# Patient Record
Sex: Male | Born: 1985 | Hispanic: No | Marital: Single | Smoking: Former smoker
Health system: Southern US, Community
[De-identification: ages and names within clinical notes are randomized; demographics above are authoritative.]

## PROBLEM LIST (undated history)

## (undated) HISTORY — PX: TOE SURGERY: SHX1073

---
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
## 2014-09-14 ENCOUNTER — Ambulatory Visit (INDEPENDENT_AMBULATORY_CARE_PROVIDER_SITE_OTHER): Payer: BC Managed Care – PPO | Admitting: Family Medicine

## 2014-09-14 ENCOUNTER — Ambulatory Visit (INDEPENDENT_AMBULATORY_CARE_PROVIDER_SITE_OTHER): Payer: BC Managed Care – PPO

## 2014-09-14 VITALS — BP 122/84 | HR 48 | Temp 98.1°F | Resp 18 | Ht 72.0 in | Wt 154.8 lb

## 2014-09-14 DIAGNOSIS — I498 Other specified cardiac arrhythmias: Secondary | ICD-10-CM

## 2014-09-14 DIAGNOSIS — R5381 Other malaise: Secondary | ICD-10-CM

## 2014-09-14 DIAGNOSIS — R001 Bradycardia, unspecified: Secondary | ICD-10-CM

## 2014-09-14 DIAGNOSIS — R5383 Other fatigue: Secondary | ICD-10-CM

## 2014-09-14 DIAGNOSIS — R079 Chest pain, unspecified: Secondary | ICD-10-CM

## 2014-09-14 LAB — POCT CBC
Granulocyte percent: 65.3 %G (ref 37–80)
HEMATOCRIT: 45.8 % (ref 43.5–53.7)
Hemoglobin: 14.9 g/dL (ref 14.1–18.1)
LYMPH, POC: 2.1 (ref 0.6–3.4)
MCH: 29.5 pg (ref 27–31.2)
MCHC: 32.6 g/dL (ref 31.8–35.4)
MCV: 90.4 fL (ref 80–97)
MID (CBC): 0.3 (ref 0–0.9)
MPV: 8.8 fL (ref 0–99.8)
POC GRANULOCYTE: 4.4 (ref 2–6.9)
POC LYMPH PERCENT: 30.8 %L (ref 10–50)
POC MID %: 3.9 %M (ref 0–12)
Platelet Count, POC: 163 10*3/uL (ref 142–424)
RBC: 5.06 M/uL (ref 4.69–6.13)
RDW, POC: 13.5 %
WBC: 6.8 10*3/uL (ref 4.6–10.2)

## 2014-09-14 NOTE — Progress Notes (Signed)
Subjective:    Patient ID: Tyrone Berg, male    DOB: [birth year], 28 y.o.   MRN: [id number]  HPI Patient presents to clinic for chest and back discomfort that has been intermittently present for 6 months. Pain is located on left side deep within chest wall and under the shoulder on left side. Pain does not radiate. Patient unable to qualify/quantify pain. "Discomfort" is present 1-2x/week and has increased to this frequency in the past few months. Activity sometimes makes discomfort better, but he has not tried any medications or ice for relief. Discomfort not associated with meal times or position changes. Duration varies, but does not last longer than minutes.   Patient states that he gets a full nights sleep (at least 8 hrs), does not snore, and has not been told that he stops breathing while he sleeps. He feels rested in the morning.  Patient quit smoking 2 days ago after smoking 1/2 pack/day intermittently for 4 years. He additionally smokes marijuana every few days and used cocaine 2-3x over a year ago.   Review of Systems  Constitutional: Positive for fatigue. Negative for chills, activity change, appetite change and unexpected weight change.  HENT: Negative for congestion, ear pain, nosebleeds, rhinorrhea, sinus pressure, sneezing and sore throat.   Eyes: Negative.   Respiratory: Negative for choking and wheezing.   Cardiovascular: Negative for leg swelling.  Gastrointestinal: Negative for diarrhea and constipation.       Denies heartburn.  Genitourinary: Negative for flank pain, discharge, scrotal swelling, difficulty urinating and testicular pain.  Musculoskeletal: Positive for arthralgias and back pain. Negative for gait problem, joint swelling and neck pain.  Skin: Negative for color change, pallor and rash.  Allergic/Immunologic: Negative.  Negative for environmental allergies and food allergies.  Psychiatric/Behavioral: Negative.  Negative for behavioral problems, sleep  disturbance and dysphoric mood. The patient is not nervous/anxious.        Objective:   Physical Exam  Constitutional: He is oriented to person, place, and time. He appears well-developed and well-nourished. No distress.  HENT:  Head: Normocephalic and atraumatic.  Right Ear: External ear normal.  Left Ear: External ear normal.  Mouth/Throat: Oropharynx is clear and moist.  Eyes: Conjunctivae are normal. Pupils are equal, round, and reactive to light. Right eye exhibits no discharge. Left eye exhibits no discharge. No scleral icterus.  Neck: Normal range of motion. Neck supple. No JVD present.  Cardiovascular: Regular rhythm and normal heart sounds.  Bradycardia present.  Exam reveals no gallop.   No murmur heard. Pulmonary/Chest: Effort normal and breath sounds normal. He has no wheezes. He has no rales. He exhibits no tenderness.  No tenderness to palpation of chest wall. Pain not reproducible.  Abdominal: Soft. Bowel sounds are normal. He exhibits no distension and no mass. There is no tenderness. There is no guarding.  Musculoskeletal: Normal range of motion. He exhibits no edema and no tenderness.  Lymphadenopathy:    He has no cervical adenopathy.  Neurological: He is alert and oriented to person, place, and time. He exhibits normal muscle tone. Coordination normal.  Skin: Skin is warm and dry. He is not diaphoretic.  Psychiatric: He has a normal mood and affect. His behavior is normal. Judgment and thought content normal.   Blood pressure 122/84, pulse 48, temperature 98.1 F (36.7 C), temperature source Oral, resp. rate 18, height 6' (1.829 m), weight 154 lb 12.8 oz (70.217 kg), SpO2 100.00%.     EKG reading with Dr. Alwyn Ren: Bradycardia [facility] reading (  PRIMARY) by  Dr. Alwyn Ren: No acute findings.  Results for orders placed in visit on 09/14/14  POCT CBC      Result Value Ref Range   WBC 6.8  4.6 - 10.2 K/uL   Lymph, poc 2.1  0.6 - 3.4   POC LYMPH PERCENT 30.8  10 - 50 %L    MID (cbc) 0.3  0 - 0.9   POC MID % 3.9  0 - 12 %M   POC Granulocyte 4.4  2 - 6.9   Granulocyte percent 65.3  37 - 80 %G   RBC 5.06  4.69 - 6.13 M/uL   Hemoglobin 14.9  14.1 - 18.1 g/dL   HCT, POC 16.1  09.6 - 53.7 %   MCV 90.4  80 - 97 fL   MCH, POC 29.5  27 - 31.2 pg   MCHC 32.6  31.8 - 35.4 g/dL   RDW, POC 04.5     Platelet Count, POC 163  142 - 424 K/uL   MPV 8.8  0 - 99.8 fL     Assessment & Plan:   1. Chest pain/Bradycardia, unspecified CXR is normal and EKG shows bradycardia. CBC is within range. At this time chest pain etiology unclear. Could be musculoskeletal, but less likely as pain not reproducible. Due to bradycardia, pain, and fatigue will refer to cardiology. - EKG 12-Lead:  - Comprehensive metabolic panel - DG Chest 2 View; Future - Cardiology referral ordered.  2. Other malaise and fatigue CBC within range. Fatigue possibly related to bradycardia. Cardiology referral ordered. - TSH - POCT CBC

## 2014-09-14 NOTE — Progress Notes (Signed)
Problems discussed with the physician assistant. Chest x-ray and EKG reviewed. Advise that patient should see a cardiologist because of the bradycardia. I would imagine that the regular marijuana use might tend to raise the heart) and lowering it. Agree with assessment and plan.

## 2014-09-15 LAB — COMPREHENSIVE METABOLIC PANEL
ALT: 13 U/L (ref 0–53)
AST: 21 U/L (ref 0–37)
Albumin: 5 g/dL (ref 3.5–5.2)
Alkaline Phosphatase: 56 U/L (ref 39–117)
BILIRUBIN TOTAL: 1.2 mg/dL (ref 0.2–1.2)
BUN: 17 mg/dL (ref 6–23)
CALCIUM: 9.9 mg/dL (ref 8.4–10.5)
CHLORIDE: 103 meq/L (ref 96–112)
CO2: 28 mEq/L (ref 19–32)
CREATININE: 0.86 mg/dL (ref 0.50–1.35)
Glucose, Bld: 76 mg/dL (ref 70–99)
POTASSIUM: 4.2 meq/L (ref 3.5–5.3)
Sodium: 142 mEq/L (ref 135–145)
Total Protein: 7.6 g/dL (ref 6.0–8.3)

## 2014-09-15 LAB — TSH: TSH: 1.335 u[IU]/mL (ref 0.350–4.500)

## 2014-10-18 ENCOUNTER — Institutional Professional Consult (permissible substitution): Payer: Self-pay | Admitting: Cardiology

## 2015-12-15 ENCOUNTER — Encounter (HOSPITAL_COMMUNITY): Payer: Self-pay | Admitting: Emergency Medicine

## 2015-12-15 DIAGNOSIS — R1030 Lower abdominal pain, unspecified: Secondary | ICD-10-CM | POA: Diagnosis present

## 2015-12-15 DIAGNOSIS — R112 Nausea with vomiting, unspecified: Secondary | ICD-10-CM | POA: Diagnosis not present

## 2015-12-15 DIAGNOSIS — R197 Diarrhea, unspecified: Secondary | ICD-10-CM | POA: Diagnosis not present

## 2015-12-15 DIAGNOSIS — R948 Abnormal results of function studies of other organs and systems: Secondary | ICD-10-CM | POA: Insufficient documentation

## 2015-12-15 DIAGNOSIS — R109 Unspecified abdominal pain: Secondary | ICD-10-CM | POA: Diagnosis not present

## 2015-12-15 DIAGNOSIS — E860 Dehydration: Secondary | ICD-10-CM | POA: Diagnosis not present

## 2015-12-15 DIAGNOSIS — Z87891 Personal history of nicotine dependence: Secondary | ICD-10-CM | POA: Diagnosis not present

## 2015-12-15 LAB — COMPREHENSIVE METABOLIC PANEL
ALK PHOS: 55 U/L (ref 38–126)
ALT: 13 U/L — AB (ref 17–63)
AST: 19 U/L (ref 15–41)
Albumin: 4.6 g/dL (ref 3.5–5.0)
Anion gap: 13 (ref 5–15)
BUN: 18 mg/dL (ref 6–20)
CALCIUM: 9.9 mg/dL (ref 8.9–10.3)
CHLORIDE: 104 mmol/L (ref 101–111)
CO2: 25 mmol/L (ref 22–32)
CREATININE: 1.06 mg/dL (ref 0.61–1.24)
Glucose, Bld: 123 mg/dL — ABNORMAL HIGH (ref 65–99)
Potassium: 3.5 mmol/L (ref 3.5–5.1)
Sodium: 142 mmol/L (ref 135–145)
TOTAL PROTEIN: 7.5 g/dL (ref 6.5–8.1)
Total Bilirubin: 1.6 mg/dL — ABNORMAL HIGH (ref 0.3–1.2)

## 2015-12-15 LAB — CBC
HCT: 42.2 % (ref 39.0–52.0)
Hemoglobin: 14.6 g/dL (ref 13.0–17.0)
MCH: 29.4 pg (ref 26.0–34.0)
MCHC: 34.6 g/dL (ref 30.0–36.0)
MCV: 85.1 fL (ref 78.0–100.0)
PLATELETS: 189 10*3/uL (ref 150–400)
RBC: 4.96 MIL/uL (ref 4.22–5.81)
RDW: 12.1 % (ref 11.5–15.5)
WBC: 13.6 10*3/uL — AB (ref 4.0–10.5)

## 2015-12-15 LAB — LIPASE, BLOOD: LIPASE: 20 U/L (ref 11–51)

## 2015-12-15 MED ORDER — FENTANYL CITRATE (PF) 100 MCG/2ML IJ SOLN
INTRAMUSCULAR | Status: AC
  Filled 2015-12-15: qty 2

## 2015-12-15 MED ORDER — FENTANYL CITRATE (PF) 100 MCG/2ML IJ SOLN
50.0000 ug | Freq: Once | INTRAMUSCULAR | Status: AC
  Administered 2015-12-15: 50 ug via NASAL

## 2015-12-15 NOTE — ED Notes (Signed)
Pt. reports low abdominal pain with emesis and diarrhea onset this afternoon , denies fever or chills.

## 2015-12-15 NOTE — ED Notes (Signed)
Pts visitor approached nurses station asking for pain medication for patient. Pt laying on bench in waiting room and appears as though he does not feel well.

## 2015-12-15 NOTE — ED Notes (Signed)
Pt reports he still does not have to urinate.

## 2015-12-16 ENCOUNTER — Emergency Department (HOSPITAL_COMMUNITY)
Admission: EM | Admit: 2015-12-16 | Discharge: 2015-12-16 | Disposition: A | Discharge status: Home / Self Care | Payer: BLUE CROSS/BLUE SHIELD | Attending: Emergency Medicine | Admitting: Emergency Medicine

## 2015-12-16 DIAGNOSIS — R112 Nausea with vomiting, unspecified: Secondary | ICD-10-CM

## 2015-12-16 DIAGNOSIS — R109 Unspecified abdominal pain: Secondary | ICD-10-CM

## 2015-12-16 DIAGNOSIS — R17 Unspecified jaundice: Secondary | ICD-10-CM

## 2015-12-16 DIAGNOSIS — E860 Dehydration: Secondary | ICD-10-CM

## 2015-12-16 DIAGNOSIS — R197 Diarrhea, unspecified: Secondary | ICD-10-CM

## 2015-12-16 LAB — URINALYSIS, ROUTINE W REFLEX MICROSCOPIC
GLUCOSE, UA: NEGATIVE mg/dL
HGB URINE DIPSTICK: NEGATIVE
Ketones, ur: >80 mg/dL — AB
Leukocytes, UA: NEGATIVE
Nitrite: NEGATIVE
Protein, ur: 100 mg/dL — AB
SPECIFIC GRAVITY, URINE: 1.039 — AB (ref 1.005–1.030)
pH: 7 (ref 5.0–8.0)

## 2015-12-16 LAB — URINE MICROSCOPIC-ADD ON

## 2015-12-16 MED ORDER — SODIUM CHLORIDE 0.9 % IV SOLN: 1000.0000 mL | INTRAVENOUS | Status: DC

## 2015-12-16 MED ORDER — SODIUM CHLORIDE 0.9 % IV SOLN
1000.0000 mL | Freq: Once | INTRAVENOUS | Status: AC
  Administered 2015-12-16: 1000 mL via INTRAVENOUS

## 2015-12-16 MED ORDER — ONDANSETRON HCL 4 MG PO TABS: 4.0000 mg | ORAL_TABLET | Freq: Four times a day (QID) | ORAL | Status: DC | PRN

## 2015-12-16 NOTE — ED Provider Notes (Signed)
CSN: [id number]     Arrival date & time 12/15/15  2005 History  By signing my name below, I, Tyrone Berg, attest that this documentation has been prepared under the direction and in the presence of Dione Booze, MD. Electronically Signed: [name], ED Scribe. 12/16/2015. 2:19 AM.      Chief Complaint  Patient presents with  . Abdominal Pain   The history is provided by the patient. No language interpreter was used.   HPI Comments: Tyrone Berg is a 29 y.o. male who presents to the Emergency Department complaining of improving mild low abdominal pain onset 12 hours PTA. Pt reports associated nausea, vomiting and diarrhea. Pt states that vomiting provided temporary relief. Pt states that he was not able to tolerate solids or liquids. Pt states that since being in the ED the pain has gradually improved on its own. Pt denies fever or there related symptoms. Pt reports similar episode of pain after eating pizza 1 week ago that was not as sever. He states that that his pain began today after eating pizza.   History reviewed. No pertinent past medical history. Past Surgical History  Procedure Laterality Date  . Toe surgery     Family History  Problem Relation Age of Onset  . Asthma Father    Social History  Substance Use Topics  . Smoking status: Former Smoker -- 0.50 packs/day for 4 years    Quit date: 09/12/2014  . Smokeless tobacco: None  . Alcohol Use: Yes    Review of Systems  Constitutional: Negative for fever and chills.  Gastrointestinal: Positive for nausea, vomiting, abdominal pain and diarrhea.  All other systems reviewed and are negative.    Allergies  Review of patient's allergies indicates no known allergies.  Home Medications   Prior to Admission medications   Not on File   BP 127/64 mmHg  Pulse 51  Temp(Src) 98.4 F (36.9 C) (Oral)  Resp 16  SpO2 98%   Physical Exam  Constitutional: He is oriented to person, place, and time. He appears  well-developed and well-nourished. No distress.  HENT:  Head: Normocephalic and atraumatic.  Eyes: Conjunctivae and EOM are normal. Pupils are equal, round, and reactive to light.  Neck: Normal range of motion. Neck supple. No JVD present.  Cardiovascular: Normal rate, regular rhythm and normal heart sounds.   No murmur heard. Pulmonary/Chest: Effort normal and breath sounds normal. He has no wheezes. He has no rales. He exhibits no tenderness.  Abdominal: Soft. He exhibits no distension and no mass. There is no guarding.  Musculoskeletal: Normal range of motion.  Lymphadenopathy:    He has no cervical adenopathy.  Neurological: He is alert and oriented to person, place, and time.  Skin: Skin is warm and dry. No rash noted.  Psychiatric: He has a normal mood and affect. His behavior is normal. Judgment and thought content normal.  Nursing note and vitals reviewed.   ED Course  Procedures (including critical care time) DIAGNOSTIC STUDIES: Oxygen Saturation is 98% on RA, normal by my interpretation.    COORDINATION OF CARE: 2:25 AM-Discussed treatment plan with pt at bedside and pt agreed to plan.     Labs Review Results for orders placed or performed during the hospital encounter of 12/16/15  Lipase, blood  Result Value Ref Range   Lipase 20 11 - 51 U/L  Comprehensive metabolic panel  Result Value Ref Range   Sodium 142 135 - 145 mmol/L   Potassium 3.5 3.5 - 5.1  mmol/L   Chloride 104 101 - 111 mmol/L   CO2 25 22 - 32 mmol/L   Glucose, Bld 123 (H) 65 - 99 mg/dL   BUN 18 6 - 20 mg/dL   Creatinine, Ser 2.441.06 0.61 - 1.24 mg/dL   Calcium 9.9 8.9 - 01.010.3 mg/dL   Total Protein 7.5 6.5 - 8.1 g/dL   Albumin 4.6 3.5 - 5.0 g/dL   AST 19 15 - 41 U/L   ALT 13 (L) 17 - 63 U/L   Alkaline Phosphatase 55 38 - 126 U/L   Total Bilirubin 1.6 (H) 0.3 - 1.2 mg/dL   GFR calc non Af Amer >60 >60 mL/min   GFR calc Af Amer >60 >60 mL/min   Anion gap 13 5 - 15  CBC  Result Value Ref Range    WBC 13.6 (H) 4.0 - 10.5 K/uL   RBC 4.96 4.22 - 5.81 MIL/uL   Hemoglobin 14.6 13.0 - 17.0 g/dL   HCT 27.242.2 53.639.0 - 64.452.0 %   MCV 85.1 78.0 - 100.0 fL   MCH 29.4 26.0 - 34.0 pg   MCHC 34.6 30.0 - 36.0 g/dL   RDW 03.412.1 74.211.5 - 59.515.5 %   Platelets 189 150 - 400 K/uL  Urinalysis, Routine w reflex microscopic (not at [facility])  Result Value Ref Range   Color, Urine AMBER (A) YELLOW   APPearance CLEAR CLEAR   Specific Gravity, Urine 1.039 (H) 1.005 - 1.030   pH 7.0 5.0 - 8.0   Glucose, UA NEGATIVE NEGATIVE mg/dL   Hgb urine dipstick NEGATIVE NEGATIVE   Bilirubin Urine SMALL (A) NEGATIVE   Ketones, ur >80 (A) NEGATIVE mg/dL   Protein, ur 638100 (A) NEGATIVE mg/dL   Nitrite NEGATIVE NEGATIVE   Leukocytes, UA NEGATIVE NEGATIVE  Urine microscopic-add on  Result Value Ref Range   Squamous Epithelial / LPF 0-5 (A) NONE SEEN   WBC, UA 0-5 0 - 5 WBC/hpf   RBC / HPF 0-5 0 - 5 RBC/hpf   Bacteria, UA FEW (A) NONE SEEN   Urine-Other MUCOUS PRESENT     MDM   Final diagnoses:  Abdominal pain, unspecified abdominal location  Nausea vomiting and diarrhea  Total bilirubin, elevated  Dehydration      Episode of nausea, vomiting and abdominal pain which is resolved. Laboratory workup shows significant dehydration with appearance specific gravity 1.039. He is given IV hydration. Laboratory workup also showed mildly elevated blood sugar as well as elevated bilirubin. Elevated bilirubin could be Gilbert's Disease. However, his father states that he had a similar episode several weeks ago and both episodes to occur after eating pizza. This is worrisome for possible biliary tract disease. He does not have any transaminases or alkaline phosphatase elevation. I have recommended that he talk with PCP about outpatient gallbladder ultrasound. He is discharged with prescription for ondansetron.   I personally performed the services described in this documentation, which was scribed in my presence. The recorded information  has been reviewed and is accurate.       Dione Boozeavid Mennie Spiller, MD 12/16/15 [phone]

## 2015-12-16 NOTE — Discharge Instructions (Signed)
Take loperamide (Imodium AD) as needed for diarrhea.  Talk with your doctor about getting an ultrasound test to see if you have gallstones.  Your bilirubin (a test of your liver) was slightly elevated today. Please talk with your primary care provider about this. 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    Abdominal Pain, Adult Many things can cause abdominal pain. Usually, abdominal pain is not caused by a disease and will improve without treatment. It can often be observed and treated at home. Your health care provider will do a physical exam and possibly order blood tests and X-rays to help determine the seriousness of your pain. However, in many cases, more time must pass before a clear cause of the pain can be found. Before that point, your health care provider may not know if you need more testing or further treatment. HOME CARE INSTRUCTIONS Monitor your abdominal pain for any changes. The following actions may help to alleviate any discomfort you are experiencing:  Only take over-the-counter or prescription medicines as directed by your health care provider.  Do not take laxatives unless directed to do so by your health care provider.  Try a clear liquid diet (broth, tea, or water) as directed by your health care provider. Slowly move to a bland diet as tolerated. SEEK MEDICAL CARE IF:  You have unexplained abdominal pain.  You have abdominal pain associated with nausea or diarrhea.  You have pain when you urinate or have a bowel movement.  You experience abdominal pain that wakes you in the night.  You have abdominal pain that is worsened or improved by eating food.  You have abdominal pain that is worsened with eating fatty foods.  You have a fever. SEEK IMMEDIATE MEDICAL CARE IF:  Your pain does not go away within 2 hours.  You keep throwing up (vomiting).  Your pain is felt only in portions of the abdomen, such as the  right side or the left lower portion of the abdomen.  You pass bloody or black tarry stools. MAKE SURE YOU:  Understand these instructions.  Will watch your condition.  Will get help right away if you are not doing well or get worse.   This information is not intended to replace advice given to you by your health care provider. Make sure you discuss any questions you have with your health care provider.   Document Released: 09/17/2005 Document Revised: 08/29/2015 Document Reviewed: 08/17/2013 Elsevier Interactive Patient Education 2016 Elsevier Inc.  Nausea and Vomiting Nausea is a sick feeling that often comes before throwing up (vomiting). Vomiting is a reflex where stomach contents come out of your mouth. Vomiting can cause severe loss of body fluids (dehydration). Children and elderly adults can become dehydrated quickly, especially if they also have diarrhea. Nausea and vomiting are symptoms of a condition or disease. It is important to find the cause of your symptoms. CAUSES   Direct irritation of the stomach lining. This irritation can result from increased acid production (gastroesophageal reflux disease), infection, food poisoning, taking certain medicines (such as nonsteroidal anti-inflammatory drugs), alcohol use, or tobacco use.  Signals from the brain.These signals could be caused by a headache, heat exposure, an inner ear disturbance, increased pressure in the brain from injury, infection, a tumor, or a concussion, pain, emotional stimulus, or metabolic problems.  An obstruction in the gastrointestinal tract (bowel obstruction).  Illnesses such as diabetes, hepatitis, gallbladder problems, appendicitis, kidney problems, cancer, sepsis,  atypical symptoms of a heart attack, or eating disorders.  Medical treatments such as chemotherapy and radiation.  Receiving medicine that makes you sleep (general anesthetic) during surgery. DIAGNOSIS Your caregiver may ask for tests to  be done if the problems do not improve after a few days. Tests may also be done if symptoms are severe or if the reason for the nausea and vomiting is not clear. Tests may include:  Urine tests.  Blood tests.  Stool tests.  Cultures (to look for evidence of infection).  X-rays or other imaging studies. Test results can help your caregiver make decisions about treatment or the need for additional tests. TREATMENT You need to stay well hydrated. Drink frequently but in small amounts.You may wish to drink water, sports drinks, clear broth, or eat frozen ice pops or gelatin dessert to help stay hydrated.When you eat, eating slowly may help prevent nausea.There are also some antinausea medicines that may help prevent nausea. HOME CARE INSTRUCTIONS   Take all medicine as directed by your caregiver.  If you do not have an appetite, do not force yourself to eat. However, you must continue to drink fluids.  If you have an appetite, eat a normal diet unless your caregiver tells you differently.  Eat a variety of complex carbohydrates (rice, wheat, potatoes, bread), lean meats, yogurt, fruits, and vegetables.  Avoid high-fat foods because they are more difficult to digest.  Drink enough water and fluids to keep your urine clear or pale yellow.  If you are dehydrated, ask your caregiver for specific rehydration instructions. Signs of dehydration may include:  Severe thirst.  Dry lips and mouth.  Dizziness.  Dark urine.  Decreasing urine frequency and amount.  Confusion.  Rapid breathing or pulse. SEEK IMMEDIATE MEDICAL CARE IF:   You have blood or brown flecks (like coffee grounds) in your vomit.  You have black or bloody stools.  You have a severe headache or stiff neck.  You are confused.  You have severe abdominal pain.  You have chest pain or trouble breathing.  You do not urinate at least once every 8 hours.  You develop cold or clammy skin.  You continue to  vomit for longer than 24 to 48 hours.  You have a fever. MAKE SURE YOU:   Understand these instructions.  Will watch your condition.  Will get help right away if you are not doing well or get worse.   This information is not intended to replace advice given to you by your health care provider. Make sure you discuss any questions you have with your health care provider.   Document Released: 12/08/2005 Document Revised: 03/01/2012 Document Reviewed: 05/07/2011 Elsevier Interactive Patient Education 2016 Elsevier Inc.  Diarrhea Diarrhea is frequent loose and watery bowel movements. It can cause you to feel weak and dehydrated. Dehydration can cause you to become tired and thirsty, have a dry mouth, and have decreased urination that often is dark yellow. Diarrhea is a sign of another problem, most often an infection that will not last long. In most cases, diarrhea typically lasts 2-3 days. However, it can last longer if it is a sign of something more serious. It is important to treat your diarrhea as directed by your caregiver to lessen or prevent future episodes of diarrhea. CAUSES  Some common causes include:  Gastrointestinal infections caused by viruses, bacteria, or parasites.  Food poisoning or food allergies.  Certain medicines, such as antibiotics, chemotherapy, and laxatives.  Artificial sweeteners and fructose.  Digestive disorders. HOME CARE INSTRUCTIONS  Ensure adequate fluid intake (hydration): Have 1 cup (8 oz) of fluid for each diarrhea episode. Avoid fluids that contain simple sugars or sports drinks, fruit juices, whole milk products, and sodas. Your urine should be clear or pale yellow if you are drinking enough fluids. Hydrate with an oral rehydration solution that you can purchase at pharmacies, retail stores, and online. You can prepare an oral rehydration solution at home by mixing the following ingredients together:   - tsp table salt.   tsp baking soda.    tsp salt substitute containing potassium chloride.  1  tablespoons sugar.  1 L (34 oz) of water.  Certain foods and beverages may increase the speed at which food moves through the gastrointestinal (GI) tract. These foods and beverages should be avoided and include:  Caffeinated and alcoholic beverages.  High-fiber foods, such as raw fruits and vegetables, nuts, seeds, and whole grain breads and cereals.  Foods and beverages sweetened with sugar alcohols, such as xylitol, sorbitol, and mannitol.  Some foods may be well tolerated and may help thicken stool including:  Starchy foods, such as rice, toast, pasta, low-sugar cereal, oatmeal, grits, baked potatoes, crackers, and bagels.  Bananas.  Applesauce.  Add probiotic-rich foods to help increase healthy bacteria in the GI tract, such as yogurt and fermented milk products.  Wash your hands well after each diarrhea episode.  Only take over-the-counter or prescription medicines as directed by your caregiver.  Take a warm bath to relieve any burning or pain from frequent diarrhea episodes. SEEK IMMEDIATE MEDICAL CARE IF:   You are unable to keep fluids down.  You have persistent vomiting.  You have blood in your stool, or your stools are black and tarry.  You do not urinate in 6-8 hours, or there is only a small amount of very dark urine.  You have abdominal pain that increases or localizes.  You have weakness, dizziness, confusion, or light-headedness.  You have a severe headache.  Your diarrhea gets worse or does not get better.  You have a fever or persistent symptoms for more than 2-3 days.  You have a fever and your symptoms suddenly get worse. MAKE SURE YOU:   Understand these instructions.  Will watch your condition.  Will get help right away if you are not doing well or get worse.   This information is not intended to replace advice given to you by your health care provider. Make sure you discuss any  questions you have with your health care provider.   Document Released: 11/28/2002 Document Revised: 12/29/2014 Document Reviewed: 08/15/2012 Elsevier Interactive Patient Education 2016 Elsevier Inc.  Ondansetron tablets What is this medicine? ONDANSETRON (on DAN se tron) is used to treat nausea and vomiting caused by chemotherapy. It is also used to prevent or treat nausea and vomiting after surgery. This medicine may be used for other purposes; ask your health care provider or pharmacist if you have questions. What should I tell my health care provider before I take this medicine? They need to know if you have any of these conditions: -heart disease -history of irregular heartbeat -liver disease -low levels of magnesium or potassium in the blood -an unusual or allergic reaction to ondansetron, granisetron, other medicines, foods, dyes, or preservatives -pregnant or trying to get pregnant -breast-feeding How should I use this medicine? Take this medicine by mouth with a glass of water. Follow the directions on your prescription label. Take your doses at regular intervals.  Do not take your medicine more often than directed. Talk to your pediatrician regarding the use of this medicine in children. Special care may be needed. Overdosage: If you think you have taken too much of this medicine contact a poison control center or emergency room at once. NOTE: This medicine is only for you. Do not share this medicine with others. What if I miss a dose? If you miss a dose, take it as soon as you can. If it is almost time for your next dose, take only that dose. Do not take double or extra doses. What may interact with this medicine? Do not take this medicine with any of the following medications: -apomorphine -certain medicines for fungal infections like fluconazole, itraconazole, ketoconazole, posaconazole,  voriconazole -cisapride -dofetilide -dronedarone -pimozide -thioridazine -ziprasidone This medicine may also interact with the following medications: -carbamazepine -certain medicines for depression, anxiety, or psychotic disturbances -fentanyl -linezolid -MAOIs like Carbex, Eldepryl, Marplan, Nardil, and Parnate -methylene blue (injected into a vein) -other medicines that prolong the QT interval (cause an abnormal heart rhythm) -phenytoin -rifampicin -tramadol This list may not describe all possible interactions. Give your health care provider a list of all the medicines, herbs, non-prescription drugs, or dietary supplements you use. Also tell them if you smoke, drink alcohol, or use illegal drugs. Some items may interact with your medicine. What should I watch for while using this medicine? Check with your doctor or health care professional right away if you have any sign of an allergic reaction. What side effects may I notice from receiving this medicine? Side effects that you should report to your doctor or health care professional as soon as possible: -allergic reactions like skin rash, itching or hives, swelling of the face, lips or tongue -breathing problems -confusion -dizziness -fast or irregular heartbeat -feeling faint or lightheaded, falls -fever and chills -loss of balance or coordination -seizures -sweating -swelling of the hands or feet -tightness in the chest -tremors -unusually weak or tired Side effects that usually do not require medical attention (report to your doctor or health care professional if they continue or are bothersome): -constipation or diarrhea -headache This list may not describe all possible side effects. Call your doctor for medical advice about side effects. You may report side effects to FDA at 1-800-FDA-1088. Where should I keep my medicine? Keep out of the reach of children. Store between 2 and 30 degrees C (36 and 86 degrees F).  Throw away any unused medicine after the expiration date. NOTE: This sheet is a summary. It may not cover all possible information. If you have questions about this medicine, talk to your doctor, pharmacist, or health care provider.    2016, Elsevier/Gold Standard. (2013-09-14 16:27:45)

## 2015-12-16 NOTE — ED Notes (Signed)
Patient verbalized understanding of discharge instructions and denies any further needs or questions at this time. VS stable. Patient ambulatory with steady gait.  

## 2016-07-03 ENCOUNTER — Other Ambulatory Visit: Payer: Self-pay | Admitting: Family Medicine

## 2016-07-03 DIAGNOSIS — R079 Chest pain, unspecified: Secondary | ICD-10-CM

## 2016-07-09 ENCOUNTER — Other Ambulatory Visit: Payer: BLUE CROSS/BLUE SHIELD

## 2016-08-11 ENCOUNTER — Encounter (INDEPENDENT_AMBULATORY_CARE_PROVIDER_SITE_OTHER): Payer: Self-pay

## 2016-08-11 ENCOUNTER — Encounter: Payer: Self-pay | Admitting: Internal Medicine

## 2016-08-11 ENCOUNTER — Ambulatory Visit (INDEPENDENT_AMBULATORY_CARE_PROVIDER_SITE_OTHER): Payer: BLUE CROSS/BLUE SHIELD | Admitting: Internal Medicine

## 2016-08-11 VITALS — BP 130/80 | HR 60 | Ht 71.0 in | Wt 156.4 lb

## 2016-08-11 DIAGNOSIS — K639 Disease of intestine, unspecified: Secondary | ICD-10-CM | POA: Diagnosis not present

## 2016-08-11 NOTE — Patient Instructions (Signed)
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 Frequently these patients have had multiple negative GI workups and CT scans.  Treatment consists of avoiding foods that cause gas (especially mexicans, beans and raw vegetables like spinach and salads)  and citrucel 1 heaping tsp twice daily with a large glass of water.  Pain should improve w/in 2 weeks and if not then consider further  work up.     Pulmonary follow up is as needed

## 2016-08-11 NOTE — Assessment & Plan Note (Addendum)
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 until proven otherwise with diet/ citrucel x 2 weeks  Discussed in detail all the  indications, usual  risks and alternatives  relative to the benefits with patient who agrees to proceed with conservative f/u as outlined  Prn  Total time devoted to counseling  = 35/8564m review case with pt/ discussion of options/alternatives/ personally creating written instructions  in presence of pt  then going over those specific  Instructions directly with the pt including how to use all of the meds but in particular covering each new medication in detail and the difference between the maintenance/automatic meds and the prns using an action plan format for the latter.

## 2016-08-11 NOTE — Progress Notes (Signed)
Subjective:     Patient ID: Joice LoftsBrad Ahlgren, male   DOB: [birth year],     MRN: [id number]  HPI  [age] yowm  Quit smoking 07/23/15 with onset of LCP in 02/2014 referred to pulmonary clinic 08/11/2016 by Dr Shelah LewandowskyElkin   08/11/2016 1st [facility] office visit/ Novali Vollman   Chief Complaint  Patient presents with  . Pulmonary Consult    Referred by Dr Jeannetta NapElkins. Pt c/o left side discomfort x 2 wks. He states chest does not feel painful, but more of a "pressure" that is constant, but better when he lies on his left side.   pain always exact same location > 2 years ant L chest diffuse  / sometime radiates to shoulder no pleuritic and no problem with exercise assoc with some bloating.    No obvious day to day or daytime variability or assoc sob/chronic cough or   chest tightness, subjective wheeze or overt sinus or hb symptoms. No unusual exp hx or h/o childhood pna/ asthma or knowledge of premature birth.  Sleeping ok without nocturnal  or early am exacerbation  of respiratory  c/o's or need for noct saba. Also denies any obvious fluctuation of symptoms with weather or environmental changes or other aggravating or alleviating factors except as outlined above   Current Medications, Allergies, Complete Past Medical History, Past Surgical History, Family History, and Social History were reviewed in [facility] electronic medical record.  ROS  The following are not active complaints unless bolded sore throat, dysphagia, dental problems, itching, sneezing,  nasal congestion or excess/ purulent secretions, ear ache,   fever, chills, sweats, unintended wt loss, classically pleuritic or exertional cp, hemoptysis,  orthopnea pnd or leg swelling, presyncope, palpitations, abdominal pain, anorexia, nausea, vomiting, diarrhea  or change in bowel or bladder habits, change in stools or urine, dysuria,hematuria,  rash, arthralgias, visual complaints, headache, numbness, weakness or ataxia or problems with walking or coordination,   change in mood/affect or memory.          Review of Systems     Objective:   Physical Exam    amb wm nad   Wt Readings from Last 3 Encounters:  08/11/16 156 lb 6.4 oz (70.9 kg)  09/14/14 154 lb 12.8 oz (70.2 kg)    Vital signs reviewed   HEENT: nl dentition, turbinates, and oropharynx. Nl external ear canals without cough reflex   NECK :  without JVD/Nodes/TM/ nl carotid upstrokes bilaterally   LUNGS: no acc muscle use,  Nl contour chest which is clear to A and P bilaterally without cough on insp or exp maneuvers   CV:  RRR  no s3 or murmur or increase in P2, no edema   ABD:  soft   with nl inspiratory excursion in the supine position. No bruits or organomegaly, bowel sounds nl - minimally tender LUQ   MS:  Nl gait/ ext warm without deformities, calf tenderness, cyanosis or clubbing No obvious joint restrictions   SKIN: warm and dry without lesions    NEURO:  alert, approp, nl sensorium with  no motor deficits     I personally reviewed images and agree with radiology impression as follows:  CXR:   07/09/16  No acute cardiopulmonary dz  Assessment:

## 2016-09-19 ENCOUNTER — Encounter: Payer: Self-pay | Admitting: Internal Medicine

## 2016-09-19 ENCOUNTER — Ambulatory Visit (INDEPENDENT_AMBULATORY_CARE_PROVIDER_SITE_OTHER): Payer: BLUE CROSS/BLUE SHIELD | Admitting: Internal Medicine

## 2016-09-19 ENCOUNTER — Other Ambulatory Visit (INDEPENDENT_AMBULATORY_CARE_PROVIDER_SITE_OTHER): Payer: BLUE CROSS/BLUE SHIELD

## 2016-09-19 VITALS — BP 114/82 | HR 60 | Ht 71.0 in | Wt 154.0 lb

## 2016-09-19 DIAGNOSIS — K639 Disease of intestine, unspecified: Secondary | ICD-10-CM | POA: Diagnosis not present

## 2016-09-19 DIAGNOSIS — R0789 Other chest pain: Secondary | ICD-10-CM

## 2016-09-19 DIAGNOSIS — R079 Chest pain, unspecified: Secondary | ICD-10-CM | POA: Insufficient documentation

## 2016-09-19 LAB — BRAIN NATRIURETIC PEPTIDE: PRO B NATRI PEPTIDE: 33 pg/mL (ref 0.0–100.0)

## 2016-09-19 LAB — TROPONIN I: TNIDX: 0 ug/l (ref 0.00–0.06)

## 2016-09-19 LAB — BASIC METABOLIC PANEL
BUN: 12 mg/dL (ref 6–23)
CHLORIDE: 107 meq/L (ref 96–112)
CO2: 31 mEq/L (ref 19–32)
Calcium: 9.1 mg/dL (ref 8.4–10.5)
Creatinine, Ser: 0.94 mg/dL (ref 0.40–1.50)
GFR: 101.62 mL/min (ref >60.00)
Glucose, Bld: 78 mg/dL (ref 70–99)
POTASSIUM: 4.6 meq/L (ref 3.5–5.1)
Sodium: 142 mEq/L (ref 135–145)

## 2016-09-19 LAB — CBC WITH DIFFERENTIAL/PLATELET
BASOS ABS: 0 10*3/uL (ref 0.0–0.1)
Basophils Relative: 0.5 % (ref 0.0–3.0)
EOS ABS: 0.1 10*3/uL (ref 0.0–0.7)
Eosinophils Relative: 1.4 % (ref 0.0–5.0)
HEMATOCRIT: 41.4 % (ref 39.0–52.0)
HEMOGLOBIN: 14.4 g/dL (ref 13.0–17.0)
Lymphocytes Relative: 42.9 % (ref 12.0–46.0)
Lymphs Abs: 1.6 10*3/uL (ref 0.7–4.0)
MCHC: 34.8 g/dL (ref 30.0–36.0)
MCV: 85.4 fl (ref 78.0–100.0)
Monocytes Absolute: 0.2 10*3/uL (ref 0.1–1.0)
Monocytes Relative: 6.4 % (ref 3.0–12.0)
Neutro Abs: 1.8 10*3/uL (ref 1.4–7.7)
Neutrophils Relative %: 48.8 % (ref 43.0–77.0)
Platelets: 174 10*3/uL (ref 150.0–400.0)
RBC: 4.84 Mil/uL (ref 4.22–5.81)
RDW: 13.2 % (ref 11.5–15.5)
WBC: 3.7 10*3/uL — ABNORMAL LOW (ref 4.0–10.5)

## 2016-09-19 LAB — SEDIMENTATION RATE: SED RATE: 1 mm/h (ref 0–15)

## 2016-09-19 LAB — TSH: TSH: 1.01 u[IU]/mL (ref 0.35–4.50)

## 2016-09-19 MED ORDER — PANTOPRAZOLE SODIUM 40 MG PO TBEC: 40.0000 mg | DELAYED_RELEASE_TABLET | Freq: Every day | ORAL | 2 refills | Status: DC

## 2016-09-19 MED ORDER — FAMOTIDINE 20 MG PO TABS: ORAL_TABLET | ORAL | 2 refills | Status: DC

## 2016-09-19 NOTE — Assessment & Plan Note (Addendum)
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    Please see instructions for details which were reviewed in writing and the patient given a copy highlighting the part that I personally wrote and discussed at today's ov.

## 2016-09-19 NOTE — Progress Notes (Signed)
Subjective:     Patient ID: Tyrone Berg, male   DOB: September 11, 1986,     MRN: [id number]    Brief patient profile:  62 yowm  Quit smoking 07/23/15 with onset of LCP in 02/2014 referred to pulmonary clinic 08/11/2016 by Tyrone Berg    History of Present Illness  08/11/2016 1st [facility] office visit/ Tyrone Berg   Chief Complaint  Patient presents with  . Pulmonary Consult    Referred by Tyrone Jeannetta Nap. Pt c/o left side discomfort x 2 wks. He states chest does not feel painful, but more of a "pressure" that is constant, but better when he lies on his left side.   pain always exact same location > 2 years ant L chest diffuse  / sometime radiates to shoulder no pleuritic and no problem with exercise assoc with some bloating.   rec  Diet/ citrucel    09/19/2016  f/u ov/Tyrone Berg re:  Chronic L CP no better  Chief Complaint  Patient presents with  . Follow-up    Pt states he followed instructions on taking citrucel and has had no improvement. He denies any new co's today.  present 24/7 since onset overall pattern is worse x 2.5 y,  Still better lying on Left side, last few days worse with light headed and one episode vomiting.  Last worked out 2 weeks ago and no worse then   No obvious day to day or daytime variability or assoc osb  excess/ purulent sputum or mucus plugs or hemoptysis or   chest tightness, subjective wheeze or overt sinus or hb symptoms. No unusual exp hx or h/o childhood pna/ asthma or knowledge of premature birth.  Sleeping ok without nocturnal  or early am exacerbation  of respiratory  c/o's or need for noct saba. Also denies any obvious fluctuation of symptoms with weather or environmental changes or other aggravating or alleviating factors except as outlined above   Current Medications, Allergies, Complete Past Medical History, Past Surgical History, Family History, and Social History were reviewed in [facility] record.  ROS  The following are not active  complaints unless bolded sore throat, dysphagia, dental problems, itching, sneezing,  nasal congestion or excess/ purulent secretions, ear ache,   fever, chills, sweats, unintended wt loss, classically pleuritic or exertional cp,  orthopnea pnd or leg swelling, presyncope, palpitations, abdominal pain, anorexia, nausea, vomiting, diarrhea  or change in bowel or bladder habits, change in stools or urine, dysuria,hematuria,  rash, arthralgias, visual complaints, headache, numbness, weakness or ataxia or problems with walking or coordination,  change in mood/affect or memory.                Objective:   Physical Exam    amb wm nad  Wt Readings from Last 3 Encounters:  09/19/16 154 lb (69.9 kg)  08/11/16 156 lb 6.4 oz (70.9 kg)  09/14/14 154 lb 12.8 oz (70.2 kg)    Vital signs reviewed    HEENT: nl dentition, turbinates, and oropharynx. Nl external ear canals without cough reflex   NECK :  without JVD/Nodes/TM/ nl carotid upstrokes bilaterally   LUNGS: no acc muscle use,  Nl contour chest which is clear to A and P bilaterally without cough on insp or exp maneuvers   CV:  RRR  no s3 or murmur or increase in P2, no edema   ABD:  soft   with nl inspiratory excursion in the supine position. No bruits or organomegaly, bowel sounds nl - minimally tender LUQ  MS:  Nl gait/ ext warm without deformities, calf tenderness, cyanosis or clubbing No obvious joint restrictions   SKIN: warm and dry without lesions    NEURO:  alert, approp, nl sensorium with  no motor deficits     I personally reviewed images and agree with radiology impression as follows:  CXR:   07/09/16  No acute cardiopulmonary dz   EKG 09/19/2016 with active cp > neg    Assessment:

## 2016-09-19 NOTE — Patient Instructions (Signed)
Pantoprazole (protonix) 40 mg   Take  30-60 min before first meal of the day and Pepcid (famotidine)  20 mg one @  bedtime until return to office - this is the best way to tell whether stomach acid is contributing to your problem.    Continue citrucel but take 1 heaping tsp twice daily with large glass of water  Please see patient coordinator before you leave today  to schedule CT chest  Please remember to go to the lab  department downstairs for your tests - we will call you with the results when they are available.  Please schedule a follow up office visit in 2 weeks, sooner if needed

## 2016-09-19 NOTE — Progress Notes (Signed)
lmtcb

## 2016-09-19 NOTE — Progress Notes (Signed)
Spoke with pt and notified of results per Dr. Wert. Pt verbalized understanding and denied any questions. 

## 2016-09-21 NOTE — Assessment & Plan Note (Signed)
Onset 02/2014 eval pulmonary clinic 08/11/2016 for L CP > rec diet and citrucel > did not take citrucel in rec doses 09/19/2016 so rec repeat on bid dosing and added gerd rx

## 2016-09-24 ENCOUNTER — Ambulatory Visit (INDEPENDENT_AMBULATORY_CARE_PROVIDER_SITE_OTHER)
Admission: RE | Admit: 2016-09-24 | Discharge: 2016-09-24 | Disposition: A | Discharge status: Home / Self Care | Payer: BLUE CROSS/BLUE SHIELD | Source: Ambulatory Visit | Attending: Internal Medicine | Admitting: Internal Medicine

## 2016-09-24 DIAGNOSIS — R0789 Other chest pain: Secondary | ICD-10-CM | POA: Diagnosis not present

## 2016-09-24 MED ORDER — IOPAMIDOL (ISOVUE-300) INJECTION 61%
80.0000 mL | Freq: Once | INTRAVENOUS | Status: AC | PRN
  Administered 2016-09-24: 80 mL via INTRAVENOUS

## 2016-09-25 ENCOUNTER — Telehealth: Payer: Self-pay | Admitting: Internal Medicine

## 2016-09-25 NOTE — Telephone Encounter (Signed)
LMTCB. Will await call await back.

## 2016-09-25 NOTE — Progress Notes (Signed)
Spoke with pt and notified of results per Dr. Wert. Pt verbalized understanding and denied any questions. 

## 2016-09-25 NOTE — Telephone Encounter (Signed)
No change rx - needs to follow the instructions exactly as written or won't be able to determine "the next step"

## 2016-09-25 NOTE — Telephone Encounter (Signed)
Patient returning call -pr °

## 2016-09-25 NOTE — Telephone Encounter (Signed)
Spoke with pt, states that he would like to know what the next step in his care is since his CT came back normal.  Pt is concerned that his anxiety is worsening his symptoms.  Pt has an ov on 10/13 with MW but wants to know if anything needs to be done between then and now.  MW please advise.  Thanks!   Instructions  Patient Instructions   Pantoprazole (protonix) 40 mg   Take  30-60 min before first meal of the day and Pepcid (famotidine)  20 mg one @  bedtime until return to office - this is the best way to tell whether stomach acid is contributing to your problem.     Continue citrucel but take 1 heaping tsp twice daily with large glass of water   Please see patient coordinator before you leave today  to schedule CT chest   Please remember to go to the lab  department downstairs for your tests - we will call you with the results when they are available.   Please schedule a follow up office visit in 2 weeks, sooner if needed

## 2016-09-25 NOTE — Telephone Encounter (Signed)
Spoke with pt,aware of recs.  Nothing further needed.  

## 2016-10-03 ENCOUNTER — Ambulatory Visit (INDEPENDENT_AMBULATORY_CARE_PROVIDER_SITE_OTHER): Payer: BLUE CROSS/BLUE SHIELD | Admitting: Internal Medicine

## 2016-10-03 ENCOUNTER — Encounter: Payer: Self-pay | Admitting: Internal Medicine

## 2016-10-03 VITALS — BP 116/64 | HR 61 | Ht 71.0 in | Wt 156.0 lb

## 2016-10-03 DIAGNOSIS — K639 Disease of intestine, unspecified: Secondary | ICD-10-CM

## 2016-10-03 DIAGNOSIS — R079 Chest pain, unspecified: Secondary | ICD-10-CM | POA: Diagnosis not present

## 2016-10-03 MED ORDER — GABAPENTIN 100 MG PO CAPS: 100.0000 mg | ORAL_CAPSULE | Freq: Three times a day (TID) | ORAL | 2 refills | Status: DC

## 2016-10-03 NOTE — Patient Instructions (Addendum)
Stop the citrucel and acid suppression  Try neurontin 100 three times daily and call in two weeks if not better for referral to a specialist   Stay out of position than causes the numbness /parasthesias = thoracic outlet syndrome   Pulmonary follow up is as needed

## 2016-10-03 NOTE — Progress Notes (Signed)
Subjective:     Patient ID: Joice LoftsBrad Mcclary, male   DOB: [birth year]    MRN: [id number]    Brief patient profile:  1925 yowm  Quit smoking 07/23/15 with onset of LCP in 02/2014 referred to pulmonary clinic 08/11/2016 by Dr Shelah LewandowskyElkin    History of Present Illness  08/11/2016 1st [facility] office visit/ Khalila Buechner   Chief Complaint  Patient presents with  . Pulmonary Consult    Referred by Dr Jeannetta NapElkins. Pt c/o left side discomfort x 2 wks. He states chest does not feel painful, but more of a "pressure" that is constant, but better when he lies on his left side.   pain always exact same location > 2 years ant L chest diffuse  / sometime radiates to shoulder no pleuritic and no problem with exercise assoc with some bloating.   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 Follow-up    Pt states he followed instructions on taking citrucel and has had no improvement. He denies any new co's today.  present 24/7 since onset overall pattern is worse x 2.5 y,  Still better lying on Left side, last few days worse with light headed and one episode vomiting.  Last worked out 2 weeks ago and no worse then  rec Pantoprazole (protonix) 40 mg   Take  30-60 min before first meal of the day and Pepcid (famotidine)  20 mg one @  bedtime until return to office - this is the best way to tell whether stomach acid is contributing to your problem.   Continue citrucel but take 1 heaping tsp twice daily with large glass of water Please see patient coordinator before you leave today  to schedule CT chest Please remember to go to the lab  department downstairs for your tests - we will call you with the results when they are available.    10/03/2016  f/u ov/[facility] re: cp/ ? Neuralgia  Chief Complaint  Patient presents with  . Follow-up    Left side pain is unchanged. No new co's today.   when lies down on L side and propping neck up w/in a minute gets  tingling all the way into finger tips and that seems to assoc with less cp    No obvious day to day or daytime variability or assoc osb  excess/ purulent sputum or mucus plugs or hemoptysis or   chest tightness, subjective wheeze or overt sinus or hb symptoms. No unusual exp hx or h/o childhood pna/ asthma or knowledge of premature birth.  Sleeping ok without nocturnal  or early am exacerbation  of respiratory  c/o's or need for noct saba. Also denies any obvious fluctuation of symptoms with weather or environmental changes or other aggravating or alleviating factors except as outlined above   Current Medications, Allergies, Complete Past Medical History, Past Surgical History, Family History, and Social History were reviewed in [facility] electronic medical record.  ROS  The following are not active complaints unless bolded sore throat, dysphagia, dental problems, itching, sneezing,  nasal congestion or excess/ purulent secretions, ear ache,   fever, chills, sweats, unintended wt loss, classically pleuritic or exertional cp,  orthopnea pnd or leg swelling, presyncope, palpitations, abdominal pain, anorexia, nausea, vomiting, diarrhea  or change in bowel or bladder habits, change in stools or urine, dysuria,hematuria,  rash, arthralgias, visual complaints, headache, numbness, weakness or ataxia or problems  with walking or coordination,  change in mood/affect or memory.                Objective:   Physical Exam    amb wm nad   10/03/2016    156   09/19/16 154 lb (69.9 kg)  08/11/16 156 lb 6.4 oz (70.9 kg)  09/14/14 154 lb 12.8 oz (70.2 kg)    Vital signs reviewed    HEENT: nl dentition, turbinates, and oropharynx. Nl external ear canals without cough reflex   NECK :  without JVD/Nodes/TM/ nl carotid upstrokes bilaterally   LUNGS: no acc muscle use,  Nl contour chest which is clear to A and P bilaterally without cough on insp or exp maneuvers   CV:  RRR  no s3 or murmur or  increase in P2, no edema   ABD:  soft   with nl inspiratory excursion in the supine position. No bruits or organomegaly, bowel sounds nl - minimally tender LUQ   MS:  Nl gait/ ext warm without deformities, calf tenderness, cyanosis or clubbing No obvious joint restrictions   SKIN: warm and dry without lesions    NEURO:  alert, approp, nl sensorium with  no motor deficits     I personally reviewed images and agree with radiology impression as follows:  CXR:   07/09/16  No acute cardiopulmonary dz   EKG 09/19/2016 with active cp > neg    Assessment:

## 2016-10-11 NOTE — Assessment & Plan Note (Addendum)
Onset 02/2014  -    CT with contrast  09/24/2016 >>> neg  - trial of neurontin 100 tid 10/03/2016 >>>  The assoc numbness in LUE suggests thoracic outlet syndrome so strongly rec he avoid those positions that cause it and consider referral to ortho or neuro in 2 weeks if not improving.   I had an extended summary/final discussion with the patient reviewing all relevant studies completed to date and  lasting 15 to 20 minutes of a 25 minute visit    Each maintenance medication was reviewed in detail including most importantly the difference between maintenance and prns and under what circumstances the prns are to be triggered using an action plan format that is not reflected in the computer generated alphabetically organized AVS.    Please see instructions for details which were reviewed in writing and the patient given a copy highlighting the part that I personally wrote and discussed at today's ov.

## 2016-10-11 NOTE — Assessment & Plan Note (Signed)
Onset 02/2014 eval pulmonary clinic 08/11/2016 for L CP > rec diet and citrucel > did not take citrucel in rec doses 09/19/2016 so rec repeat on bid dosing and added gerd rx  > d/c 10/03/2016 as not effective

## 2017-05-09 IMAGING — CT CT CHEST W/ CM
2 of 3 series · 15 of 36 positions shown, 18 images · IV contrast (ISOVUE 300)
Comparison: None.

CLINICAL DATA: Left-sided chest pain radiating to the shoulder
blade for 1 year.

EXAM:
CT CHEST WITH CONTRAST
TECHNIQUE: Multidetector CT imaging of the chest was performed during
intravenous contrast administration.
CONTRAST:  80mL ZTEHRZ-V66 IOPAMIDOL (ZTEHRZ-V66) INJECTION 61%

[Series 2: thorax · axial · 0.76mm/px · z∈[-285,-1]mm · 12 of 168 slices shown, 15 images]
[im 13/168  mediastinal]
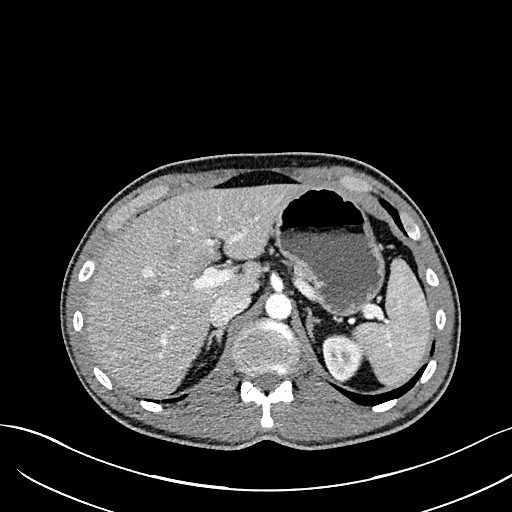
[im 13/168  lung]
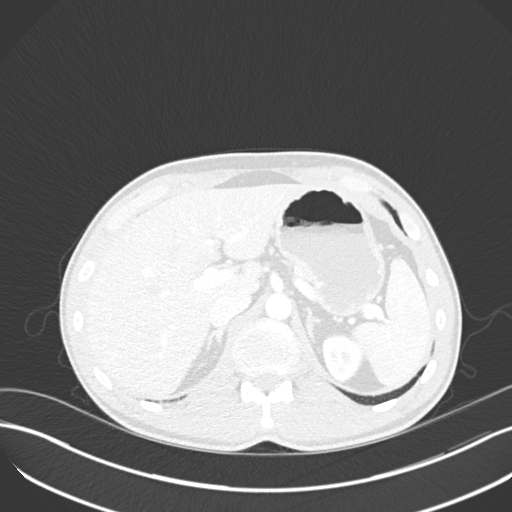
[im 25/168  lung]
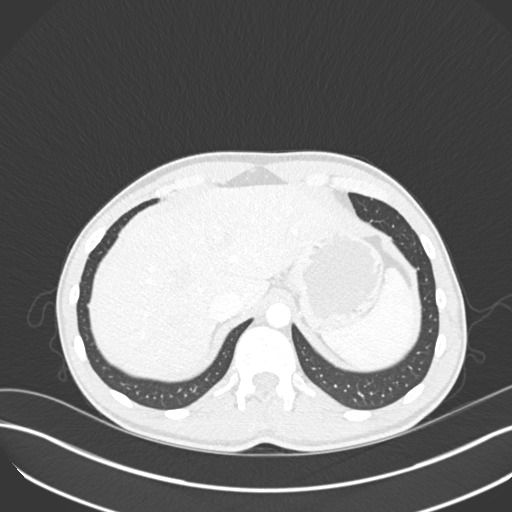
[im 38/168  lung]
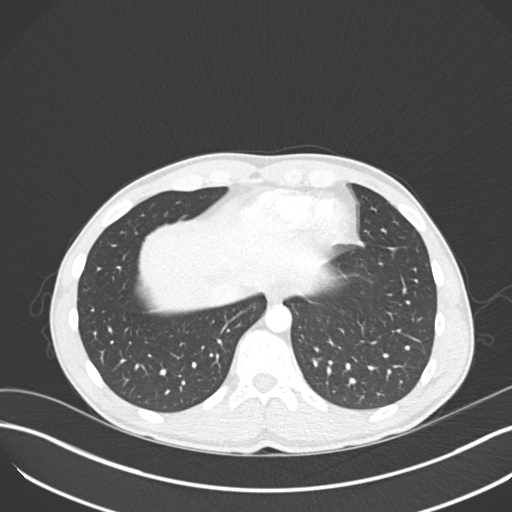
[im 50/168  lung]
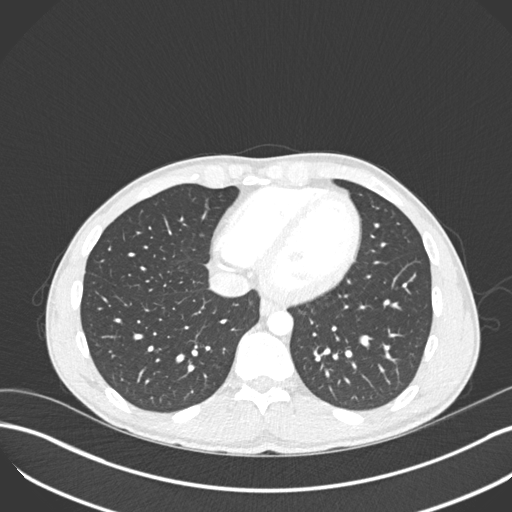
[im 62/168  mediastinal]
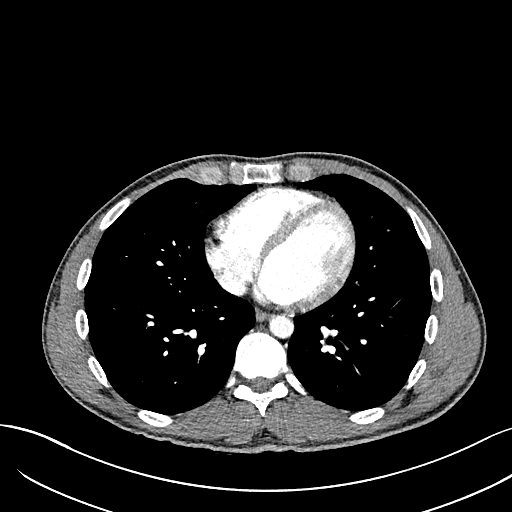
[im 62/168  lung]
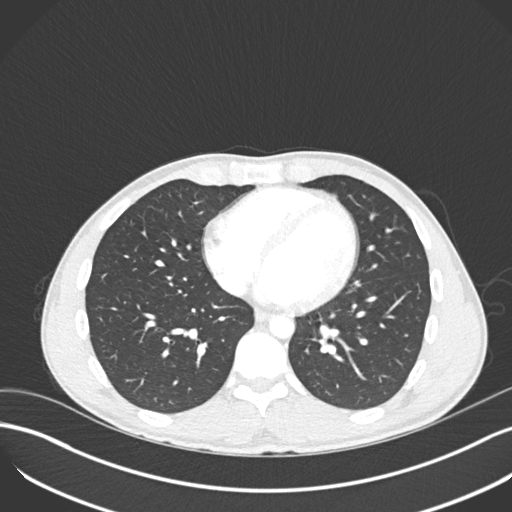
[im 75/168  lung]
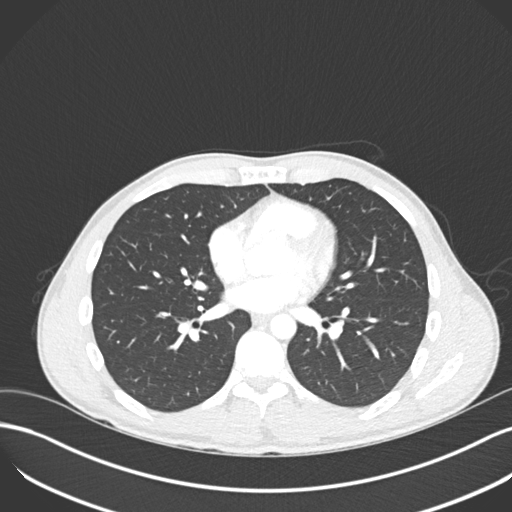
[im 93/168  lung]
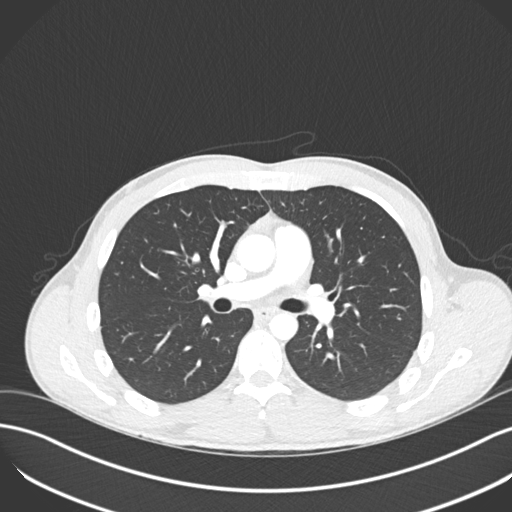
[im 106/168  lung]
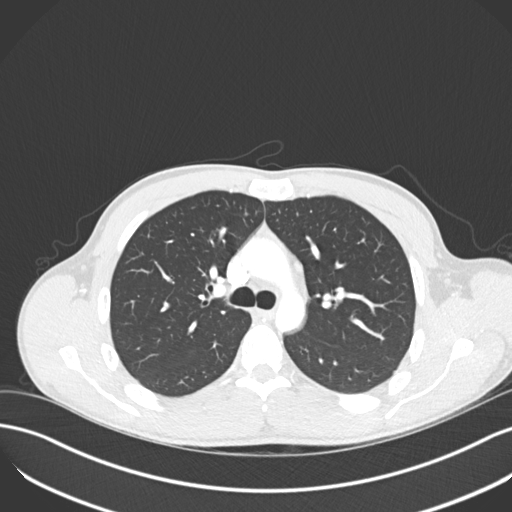
[im 118/168  mediastinal]
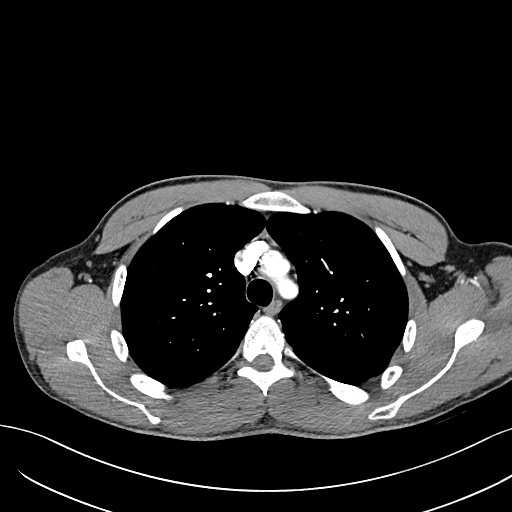
[im 118/168  lung]
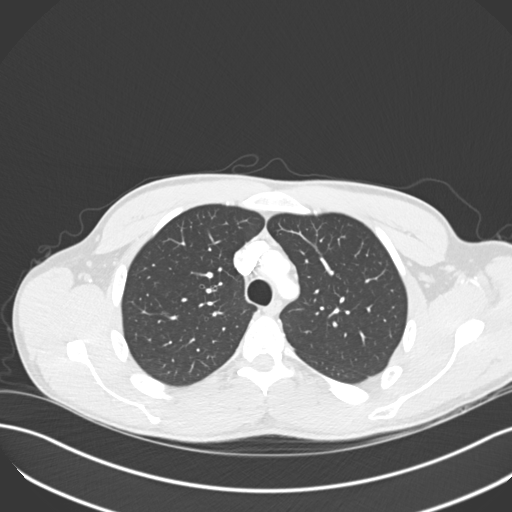
[im 130/168  lung]
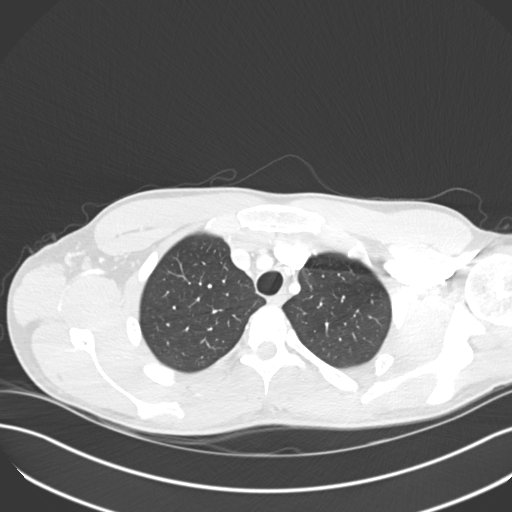
[im 143/168  lung]
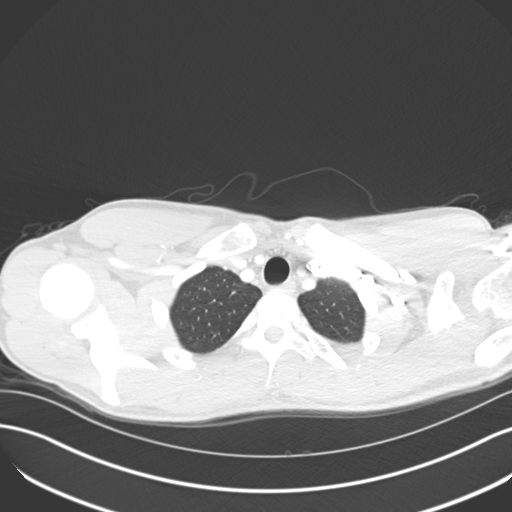
[im 155/168  lung]
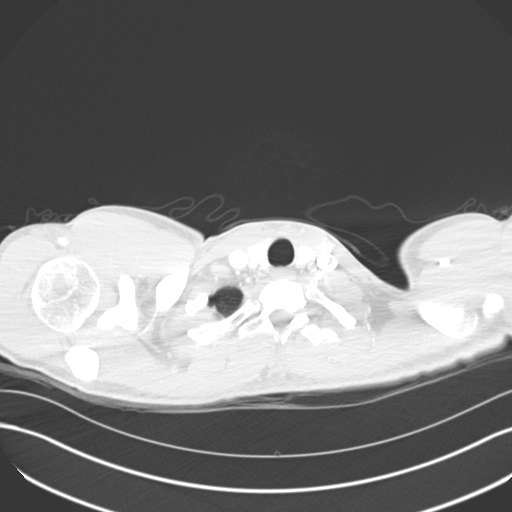

[Series 5: coronal · coronal · 0.62mm/px · 3 of 109 slices shown]
[im 22/109  lung]
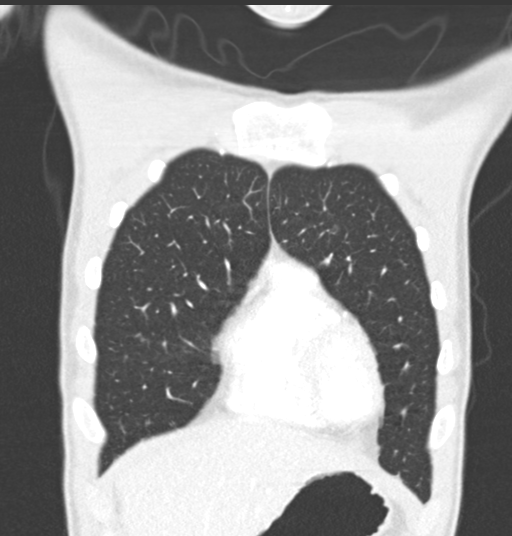
[im 44/109  lung]
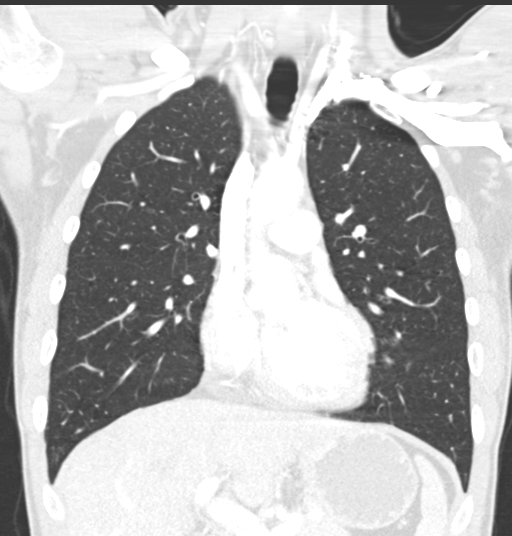
[im 65/109  lung]
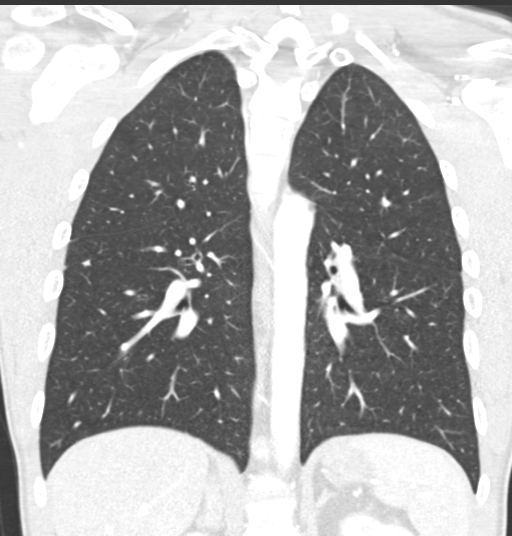

[15 of 36 positions shown; findings below may reference images not displayed]

FINDINGS: Cardiovascular: Vascular structures are unremarkable. Heart size
normal. No pericardial effusion.

Mediastinum/Nodes: No pathologically enlarged mediastinal, hilar or
axillary lymph nodes. Triangular-shaped soft tissue in the
prevascular space is most consistent with thymic tissue. Esophagus
is grossly unremarkable.

Lungs/Pleura: A few scattered subpleural nodules measure 5 mm or
less in size and are most indicative of subpleural lymph nodes. No
pleural fluid. Airway is unremarkable.

Upper Abdomen: Visualized portions of the liver, adrenal glands,
kidneys, spleen, pancreas and stomach are grossly unremarkable.

Musculoskeletal: No worrisome lytic or sclerotic lesions.
IMPRESSION: No findings to explain the patient's pain.

## 2022-08-14 ENCOUNTER — Other Ambulatory Visit: Payer: Self-pay

## 2022-08-14 ENCOUNTER — Emergency Department (HOSPITAL_COMMUNITY): Payer: Commercial Managed Care - HMO

## 2022-08-14 ENCOUNTER — Encounter (HOSPITAL_COMMUNITY): Payer: Self-pay | Admitting: Emergency Medicine

## 2022-08-14 ENCOUNTER — Emergency Department (HOSPITAL_COMMUNITY)
Admission: EM | Admit: 2022-08-14 | Discharge: 2022-08-14 | Disposition: A | Discharge status: Home / Self Care | Payer: Commercial Managed Care - HMO | Attending: Emergency Medicine | Admitting: Emergency Medicine

## 2022-08-14 DIAGNOSIS — N200 Calculus of kidney: Secondary | ICD-10-CM | POA: Insufficient documentation

## 2022-08-14 DIAGNOSIS — R1031 Right lower quadrant pain: Secondary | ICD-10-CM | POA: Diagnosis present

## 2022-08-14 LAB — URINALYSIS, ROUTINE W REFLEX MICROSCOPIC
Bilirubin Urine: NEGATIVE
Glucose, UA: NEGATIVE mg/dL
Hgb urine dipstick: NEGATIVE
Ketones, ur: NEGATIVE mg/dL
Leukocytes,Ua: NEGATIVE
Nitrite: NEGATIVE
Protein, ur: NEGATIVE mg/dL
Specific Gravity, Urine: 1.025 (ref 1.005–1.030)
pH: 5 (ref 5.0–8.0)

## 2022-08-14 LAB — COMPREHENSIVE METABOLIC PANEL
ALT: 18 U/L (ref 0–44)
AST: 24 U/L (ref 15–41)
Albumin: 4.1 g/dL (ref 3.5–5.0)
Alkaline Phosphatase: 50 U/L (ref 38–126)
Anion gap: 7 (ref 5–15)
BUN: 12 mg/dL (ref 6–20)
CO2: 24 mmol/L (ref 22–32)
Calcium: 9.2 mg/dL (ref 8.9–10.3)
Chloride: 109 mmol/L (ref 98–111)
Creatinine, Ser: 1.04 mg/dL (ref 0.61–1.24)
GFR, Estimated: >60 mL/min (ref >60)
Glucose, Bld: 105 mg/dL — ABNORMAL HIGH (ref 70–99)
Potassium: 4.6 mmol/L (ref 3.5–5.1)
Sodium: 140 mmol/L (ref 135–145)
Total Bilirubin: 1.1 mg/dL (ref 0.3–1.2)
Total Protein: 7 g/dL (ref 6.5–8.1)

## 2022-08-14 LAB — CBC WITH DIFFERENTIAL/PLATELET
Abs Immature Granulocytes: 0.01 10*3/uL (ref 0.00–0.07)
Basophils Absolute: 0 10*3/uL (ref 0.0–0.1)
Basophils Relative: 1 %
Eosinophils Absolute: 0.1 10*3/uL (ref 0.0–0.5)
Eosinophils Relative: 1 %
HCT: 44.1 % (ref 39.0–52.0)
Hemoglobin: 14.9 g/dL (ref 13.0–17.0)
Immature Granulocytes: 0 %
Lymphocytes Relative: 36 %
Lymphs Abs: 1.8 10*3/uL (ref 0.7–4.0)
MCH: 29.3 pg (ref 26.0–34.0)
MCHC: 33.8 g/dL (ref 30.0–36.0)
MCV: 86.8 fL (ref 80.0–100.0)
Monocytes Absolute: 0.4 10*3/uL (ref 0.1–1.0)
Monocytes Relative: 8 %
Neutro Abs: 2.7 10*3/uL (ref 1.7–7.7)
Neutrophils Relative %: 54 %
Platelets: 202 10*3/uL (ref 150–400)
RBC: 5.08 MIL/uL (ref 4.22–5.81)
RDW: 12 % (ref 11.5–15.5)
WBC: 5.1 10*3/uL (ref 4.0–10.5)
nRBC: 0 % (ref 0.0–0.2)

## 2022-08-14 LAB — LIPASE, BLOOD: Lipase: 56 U/L — ABNORMAL HIGH (ref 11–51)

## 2022-08-14 MED ORDER — TAMSULOSIN HCL 0.4 MG PO CAPS: 0.4000 mg | ORAL_CAPSULE | Freq: Every day | ORAL | 0 refills | Status: DC

## 2022-08-14 MED ORDER — ONDANSETRON HCL 4 MG/2ML IJ SOLN
4.0000 mg | Freq: Once | INTRAMUSCULAR | Status: AC
  Administered 2022-08-14: 4 mg via INTRAVENOUS
  Filled 2022-08-14: qty 2

## 2022-08-14 MED ORDER — OXYCODONE-ACETAMINOPHEN 5-325 MG PO TABS: 1.0000 | ORAL_TABLET | ORAL | 0 refills | Status: AC | PRN

## 2022-08-14 MED ORDER — KETOROLAC TROMETHAMINE 30 MG/ML IJ SOLN
30.0000 mg | Freq: Once | INTRAMUSCULAR | Status: AC
  Administered 2022-08-14: 30 mg via INTRAVENOUS
  Filled 2022-08-14: qty 1

## 2022-08-14 MED ORDER — HYDROMORPHONE HCL 1 MG/ML IJ SOLN
1.0000 mg | Freq: Once | INTRAMUSCULAR | Status: AC
  Administered 2022-08-14: 1 mg via INTRAVENOUS
  Filled 2022-08-14: qty 1

## 2022-08-14 MED ORDER — ONDANSETRON 4 MG PO TBDP: 4.0000 mg | ORAL_TABLET | Freq: Three times a day (TID) | ORAL | 0 refills | Status: DC | PRN

## 2022-08-14 MED ORDER — IOHEXOL 350 MG/ML SOLN
100.0000 mL | Freq: Once | INTRAVENOUS | Status: AC | PRN
  Administered 2022-08-14: 100 mL via INTRAVENOUS

## 2022-08-14 NOTE — ED Triage Notes (Signed)
Sudden onset of right lower quadrant abdominal pain.  Mild nausea.  No fever/chills.

## 2022-08-14 NOTE — ED Notes (Signed)
Pt back from CT

## 2022-08-14 NOTE — Discharge Instructions (Signed)
Return if any problems.

## 2022-08-14 NOTE — ED Notes (Signed)
Patient transported to CT 

## 2022-08-14 NOTE — ED Provider Triage Note (Signed)
Emergency Medicine Provider Triage Evaluation Note  Tyrone Berg , a 36 y.o. male  was evaluated in triage.  Pt complains of right lower quadrant abdominal pain.  Patient states the pain was sudden onset at 830 this morning.  He describes sharp severe pain in the right lower quadrant.  He endorses mild nausea.  He states the pain is worse with movement.  He denies shortness of breath or chest pain.  Review of Systems  Positive: As above Negative: As above  Physical Exam  BP (!) 154/100 (BP Location: Right Arm)   Pulse (!) 45   Temp 97.7 F (36.5 C) (Oral)   Resp (!) 22   SpO2 100%  Gen:   Awake, patient appears in pain Resp:  Normal effort  MSK:   Moves extremities without difficulty  Other:    Medical Decision Making  Medically screening exam initiated at 9:17 AM.  Appropriate orders placed.  Tyrone Berg was informed that the remainder of the evaluation will be completed by another provider, this initial triage assessment does not replace that evaluation, and the importance of remaining in the ED until their evaluation is complete.     Darrick Grinder, [place] 08/14/22 [phone]

## 2022-08-15 NOTE — ED Provider Notes (Signed)
[facility] EMERGENCY DEPARTMENT Provider Note   CSN: [id number] Arrival date & time: 08/14/22  8921     History  Chief Complaint  Patient presents with   Abdominal Pain    Tyrone Berg is a 36 y.o. male.  Pt complains of sudden onset of right lower abdominal pain this am.  Pt reports severe pain.    The history is provided by the patient. No language interpreter was used.  Abdominal Pain Pain location:  RLQ Pain quality: aching   Pain radiates to:  Does not radiate Pain severity:  Moderate Onset quality:  Gradual Timing:  Constant Progression:  Worsening Chronicity:  New Context: alcohol use   Relieved by:  Nothing Worsened by:  Nothing Ineffective treatments:  None tried Associated symptoms: no belching, no diarrhea and no fever   Risk factors: no alcohol abuse        Home Medications Prior to Admission medications   Medication Sig Start Date End Date Taking? Authorizing Provider  ondansetron (ZOFRAN-ODT) 4 MG disintegrating tablet Take 1 tablet (4 mg total) by mouth every 8 (eight) hours as needed for nausea or vomiting. 08/14/22  Yes Elson Areas, PA-C  oxyCODONE-acetaminophen (PERCOCET) 5-325 MG tablet Take 1 tablet by mouth every 4 (four) hours as needed for severe pain. 08/14/22 08/14/23 Yes Elson Areas, PA-C  tamsulosin (FLOMAX) 0.4 MG CAPS capsule Take 1 capsule (0.4 mg total) by mouth daily. 08/14/22  Yes Cheron Schaumann K, PA-C  gabapentin (NEURONTIN) 100 MG capsule Take 1 capsule (100 mg total) by mouth 3 (three) times daily. One three times daily 10/03/16   Nyoka Cowden, MD      Allergies    Patient has no known allergies.    Review of Systems   Review of Systems  Constitutional:  Negative for fever.  Gastrointestinal:  Positive for abdominal pain. Negative for diarrhea.  All other systems reviewed and are negative.   Physical Exam Updated Vital Signs BP 110/64   Pulse (!) 51   Temp 97.6 F (36.4 C) (Oral)   Resp 16    SpO2 96%  Physical Exam Vitals and nursing note reviewed.  Constitutional:      General: He is not in acute distress.    Appearance: He is well-developed.  HENT:     Head: Normocephalic and atraumatic.  Eyes:     Conjunctiva/sclera: Conjunctivae normal.  Cardiovascular:     Rate and Rhythm: Normal rate and regular rhythm.     Heart sounds: No murmur heard. Pulmonary:     Effort: Pulmonary effort is normal. No respiratory distress.     Breath sounds: Normal breath sounds.  Abdominal:     General: Abdomen is flat.     Palpations: Abdomen is soft.     Tenderness: There is no abdominal tenderness.  Musculoskeletal:        General: No swelling.     Cervical back: Neck supple.  Skin:    General: Skin is warm and dry.     Capillary Refill: Capillary refill takes less than 2 seconds.  Neurological:     Mental Status: He is alert.  Psychiatric:        Mood and Affect: Mood normal.     ED Results / Procedures / Treatments   Labs (all labs ordered are listed, but only abnormal results are displayed) Labs Reviewed  COMPREHENSIVE METABOLIC PANEL - Abnormal; Notable for the following components:      Result Value   Glucose,  Bld 105 (*)    All other components within normal limits  LIPASE, BLOOD - Abnormal; Notable for the following components:   Lipase 56 (*)    All other components within normal limits  URINALYSIS, ROUTINE W REFLEX MICROSCOPIC - Abnormal; Notable for the following components:   APPearance HAZY (*)    All other components within normal limits  CBC WITH DIFFERENTIAL/PLATELET    EKG None  Radiology CT Abdomen Pelvis W Contrast  Result Date: 08/14/2022 CLINICAL DATA:  Right lower quadrant abdominal pain. EXAM: CT ABDOMEN AND PELVIS WITH CONTRAST TECHNIQUE: Multidetector CT imaging of the abdomen and pelvis was performed using the standard protocol following bolus administration of intravenous contrast. RADIATION DOSE REDUCTION: This exam was performed according  to the departmental dose-optimization program which includes automated exposure control, adjustment of the mA and/or kV according to patient size and/or use of iterative reconstruction technique. CONTRAST:  OMNIPAQUE IOHEXOL 350 MG/ML SOLN COMPARISON:  None Available. FINDINGS: Lower chest: The lung bases are clear. The heart size is normal. No significant pleural or pericardial effusion is present. Hepatobiliary: No focal liver abnormality is seen. No gallstones, gallbladder wall thickening, or biliary dilatation. Pancreas: Unremarkable. No pancreatic ductal dilatation or surrounding inflammatory changes. Spleen: Normal size. Ill-defined hypodense lesion near the inferior aspect of the spleen measures 13 mm maximally. No other focal lesions are present. Adrenals/Urinary Tract: The adrenal glands are within normal limits bilaterally. Left kidney is unremarkable. A 4.5 mm nonobstructing stone is present in the interpolar region of the right kidney. Punctate nonobstructing stone is present at the upper pole of the right kidney. Mild inflammatory changes are noted about the proximal right ureter collecting system without significant proximal obstruction. There is some distal obstruction with an obstructing 3 mm right UVJ stone. The urinary bladder is otherwise within normal limits. Stomach/Bowel: Stomach is within normal limits. Appendix appears normal. No evidence of bowel wall thickening, distention, or inflammatory changes. Vascular/Lymphatic: No significant vascular findings are present. No enlarged abdominal or pelvic lymph nodes. Reproductive: Prostate is unremarkable. Other: No abdominal wall hernia or abnormality. No abdominopelvic ascites. Musculoskeletal: Vertebral body heights and alignment are normal. Bony pelvis is within normal limits. Hips are located and normal. No focal osseous lesions are present. IMPRESSION: 1. Obstructing 3 mm right UVJ stone with some distal obstruction. 2. Additional  nonobstructing stones in the right kidney. 3. Normal appearance of the appendix. 4. 13 mm ill-defined hypodense lesion near the inferior aspect of the spleen is likely benign but nonspecific. Recommend non emergent MRI of the abdomen without and with contrast for further evaluation as the patient's condition allows. Electronically Signed   By: Marin Roberts M.D.   On: 08/14/2022 13:05    Procedures Procedures    Medications Ordered in ED Medications  iohexol (OMNIPAQUE) 350 MG/ML injection 100 mL (100 mLs Intravenous Contrast Given 08/14/22 1247)  HYDROmorphone (DILAUDID) injection 1 mg (1 mg Intravenous Given 08/14/22 1340)  ketorolac (TORADOL) 30 MG/ML injection 30 mg (30 mg Intravenous Given 08/14/22 1340)  ondansetron (ZOFRAN) injection 4 mg (4 mg Intravenous Given 08/14/22 1340)    ED Course/ Medical Decision Making/ A&P                           Medical Decision Making Amount and/or Complexity of Data Reviewed Independent Historian:     Details: Family here with family  Labs: ordered. Decision-making details documented in ED Course.    Details: Labs ordered  reviewed and interpreted  Radiology: ordered and independent interpretation performed. Decision-making details documented in ED Course.    Details: 74mm UVJ stone .  Ct ordered reviewed and interpreted.  Pt advised of results.  Pt advised to follow up with Urology.  Pt advised of results and need for follow up for MRI   Risk Prescription drug management.           Final Clinical Impression(s) / ED Diagnoses Final diagnoses:  Kidney stone    Rx / DC Orders ED Discharge Orders          Ordered    oxyCODONE-acetaminophen (PERCOCET) 5-325 MG tablet  Every 4 hours PRN        08/14/22 1459    ondansetron (ZOFRAN-ODT) 4 MG disintegrating tablet  Every 8 hours PRN        08/14/22 1459    tamsulosin (FLOMAX) 0.4 MG CAPS capsule  Daily        08/14/22 1459           An After Visit Summary was printed and  given to the patient.    Elson Areas, [place] 08/15/22 5009    Rolan Bucco, MD 08/18/22 [phone]

## 2023-03-11 ENCOUNTER — Other Ambulatory Visit: Payer: Self-pay

## 2023-03-11 ENCOUNTER — Emergency Department (HOSPITAL_COMMUNITY)
Admission: EM | Admit: 2023-03-11 | Discharge: 2023-03-11 | Disposition: A | Discharge status: Home / Self Care | Payer: Medicaid Other | Attending: Emergency Medicine | Admitting: Emergency Medicine

## 2023-03-11 DIAGNOSIS — Y92410 Unspecified street and highway as the place of occurrence of the external cause: Secondary | ICD-10-CM | POA: Diagnosis not present

## 2023-03-11 DIAGNOSIS — M25571 Pain in right ankle and joints of right foot: Secondary | ICD-10-CM | POA: Insufficient documentation

## 2023-03-11 DIAGNOSIS — M545 Low back pain, unspecified: Secondary | ICD-10-CM | POA: Insufficient documentation

## 2023-03-11 MED ORDER — IBUPROFEN 800 MG PO TABS
800.0000 mg | ORAL_TABLET | Freq: Once | ORAL | Status: AC
  Administered 2023-03-11: 800 mg via ORAL
  Filled 2023-03-11: qty 1

## 2023-03-11 MED ORDER — IBUPROFEN 600 MG PO TABS: 600.0000 mg | ORAL_TABLET | Freq: Four times a day (QID) | ORAL | 0 refills | Status: AC | PRN

## 2023-03-11 MED ORDER — CYCLOBENZAPRINE HCL 10 MG PO TABS: 10.0000 mg | ORAL_TABLET | Freq: Two times a day (BID) | ORAL | 0 refills | Status: DC | PRN
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
## 2023-03-13 DIAGNOSIS — M545 Low back pain, unspecified: Secondary | ICD-10-CM | POA: Diagnosis not present

## 2023-03-13 DIAGNOSIS — M25571 Pain in right ankle and joints of right foot: Secondary | ICD-10-CM | POA: Diagnosis not present

## 2024-02-22 ENCOUNTER — Emergency Department (HOSPITAL_COMMUNITY)

## 2024-02-22 ENCOUNTER — Encounter (HOSPITAL_COMMUNITY): Payer: Self-pay

## 2024-02-22 ENCOUNTER — Emergency Department (HOSPITAL_COMMUNITY)
Admission: EM | Admit: 2024-02-22 | Discharge: 2024-02-22 | Disposition: A | Discharge status: Home / Self Care | Attending: Emergency Medicine | Admitting: Emergency Medicine

## 2024-02-22 ENCOUNTER — Other Ambulatory Visit: Payer: Self-pay

## 2024-02-22 DIAGNOSIS — Z87891 Personal history of nicotine dependence: Secondary | ICD-10-CM | POA: Insufficient documentation

## 2024-02-22 DIAGNOSIS — D7389 Other diseases of spleen: Secondary | ICD-10-CM | POA: Diagnosis not present

## 2024-02-22 DIAGNOSIS — N132 Hydronephrosis with renal and ureteral calculous obstruction: Secondary | ICD-10-CM | POA: Diagnosis not present

## 2024-02-22 DIAGNOSIS — R109 Unspecified abdominal pain: Secondary | ICD-10-CM

## 2024-02-22 DIAGNOSIS — R1011 Right upper quadrant pain: Secondary | ICD-10-CM | POA: Diagnosis not present

## 2024-02-22 DIAGNOSIS — S301XXA Contusion of abdominal wall, initial encounter: Secondary | ICD-10-CM | POA: Diagnosis not present

## 2024-02-22 DIAGNOSIS — N200 Calculus of kidney: Secondary | ICD-10-CM | POA: Diagnosis not present

## 2024-02-22 DIAGNOSIS — K828 Other specified diseases of gallbladder: Secondary | ICD-10-CM | POA: Diagnosis not present

## 2024-02-22 LAB — COMPREHENSIVE METABOLIC PANEL
ALT: 17 U/L (ref 0–44)
AST: 21 U/L (ref 15–41)
Albumin: 3.9 g/dL (ref 3.5–5.0)
Alkaline Phosphatase: 48 U/L (ref 38–126)
Anion gap: 12 (ref 5–15)
BUN: 23 mg/dL — ABNORMAL HIGH (ref 6–20)
CO2: 21 mmol/L — ABNORMAL LOW (ref 22–32)
Calcium: 9 mg/dL (ref 8.9–10.3)
Chloride: 100 mmol/L (ref 98–111)
Creatinine, Ser: 1.57 mg/dL — ABNORMAL HIGH (ref 0.61–1.24)
GFR, Estimated: 59 mL/min — ABNORMAL LOW (ref >60)
Glucose, Bld: 106 mg/dL — ABNORMAL HIGH (ref 70–99)
Potassium: 3.8 mmol/L (ref 3.5–5.1)
Sodium: 133 mmol/L — ABNORMAL LOW (ref 135–145)
Total Bilirubin: 1.8 mg/dL — ABNORMAL HIGH (ref 0.0–1.2)
Total Protein: 6.9 g/dL (ref 6.5–8.1)

## 2024-02-22 LAB — URINALYSIS, ROUTINE W REFLEX MICROSCOPIC
Glucose, UA: NEGATIVE mg/dL
Hgb urine dipstick: NEGATIVE
Ketones, ur: >80 mg/dL — AB
Leukocytes,Ua: NEGATIVE
Nitrite: NEGATIVE
Protein, ur: NEGATIVE mg/dL
Specific Gravity, Urine: >1.03 — ABNORMAL HIGH (ref 1.005–1.030)
pH: 5.5 (ref 5.0–8.0)

## 2024-02-22 LAB — CBC
HCT: 39.4 % (ref 39.0–52.0)
Hemoglobin: 14 g/dL (ref 13.0–17.0)
MCH: 29 pg (ref 26.0–34.0)
MCHC: 35.5 g/dL (ref 30.0–36.0)
MCV: 81.7 fL (ref 80.0–100.0)
Platelets: 197 10*3/uL (ref 150–400)
RBC: 4.82 MIL/uL (ref 4.22–5.81)
RDW: 11.9 % (ref 11.5–15.5)
WBC: 11.3 10*3/uL — ABNORMAL HIGH (ref 4.0–10.5)
nRBC: 0 % (ref 0.0–0.2)

## 2024-02-22 LAB — LIPASE, BLOOD: Lipase: 25 U/L (ref 11–51)

## 2024-02-22 MED ORDER — ONDANSETRON HCL 4 MG/2ML IJ SOLN
4.0000 mg | Freq: Once | INTRAMUSCULAR | Status: AC
  Administered 2024-02-22: 4 mg via INTRAVENOUS
  Filled 2024-02-22: qty 2

## 2024-02-22 MED ORDER — ONDANSETRON HCL 4 MG PO TABS: 4.0000 mg | ORAL_TABLET | Freq: Four times a day (QID) | ORAL | 0 refills | Status: AC

## 2024-02-22 MED ORDER — TAMSULOSIN HCL 0.4 MG PO CAPS: 0.4000 mg | ORAL_CAPSULE | Freq: Every day | ORAL | 0 refills | Status: AC

## 2024-02-22 MED ORDER — SODIUM CHLORIDE 0.9 % IV BOLUS
1000.0000 mL | Freq: Once | INTRAVENOUS | Status: AC
  Administered 2024-02-22: 1000 mL via INTRAVENOUS

## 2024-02-22 MED ORDER — KETOROLAC TROMETHAMINE 15 MG/ML IJ SOLN
15.0000 mg | Freq: Once | INTRAMUSCULAR | Status: AC
  Administered 2024-02-22: 15 mg via INTRAVENOUS
  Filled 2024-02-22: qty 1

## 2024-02-22 MED ORDER — HYDROCODONE-ACETAMINOPHEN 5-325 MG PO TABS: 2.0000 | ORAL_TABLET | ORAL | 0 refills | Status: AC | PRN

## 2024-02-22 MED ORDER — KETOROLAC TROMETHAMINE 30 MG/ML IJ SOLN
15.0000 mg | Freq: Once | INTRAMUSCULAR | Status: AC
  Administered 2024-02-22: 15 mg via INTRAVENOUS
  Filled 2024-02-22: qty 1

## 2024-02-22 MED ORDER — MORPHINE SULFATE (PF) 4 MG/ML IV SOLN
4.0000 mg | Freq: Once | INTRAVENOUS | Status: AC
  Administered 2024-02-22: 4 mg via INTRAVENOUS
  Filled 2024-02-22: qty 1

## 2024-02-22 NOTE — ED Provider Notes (Signed)
 [place] [facility] AT [facility] Provider Note  CSN: [id number] Arrival date & time: 02/22/24 0865  Chief Complaint(s) Abdominal Pain  HPI Tyrone Berg is a 38 y.o. male history of kidney stones presenting to the emergency department with right upper quadrant pain.  Patient reports right upper quadrant pain for the past few days, associated nausea and vomiting.  Does report the pain radiates to his back.  Does not feel similar to prior kidney stone.  No painful urination.  No fevers or chills.  No chest pain, shortness of breath, sore throat, runny nose   Past Medical History History reviewed. No pertinent past medical history. 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 Authorizing Provider  ibuprofen (ADVIL) 600 MG tablet Take 1 tablet (600 mg total) by mouth every 6 (six) hours as needed. 03/11/23  Yes Fayrene Helper, PA-C                                                                                                                                    Past Surgical History Past Surgical History:  Procedure Laterality Date   TOE SURGERY     Family History Family History  Problem Relation Age of Onset   Childhood respiratory disease Father        asthma    Social History Social History   Tobacco Use   Smoking status: Former    Current packs/day: 0.00    Average packs/day: 0.5 packs/day for 8.0 years (4.0 ttl pk-yrs)    Types: Cigarettes    Start date: 07/23/2007    Quit date: 07/23/2015    Years since quitting: 8.5   Smokeless tobacco: Never   Tobacco comments:    USES VAPE 08/11/16   Substance Use Topics   Alcohol use: Yes   Drug use: Yes    Types: Marijuana    Comment: uses marijuana a few times/week; used cocaine 2-3x over a year ago (2013)   Allergies Patient has no known  allergies.  Review of Systems Review of Systems  All other systems reviewed and are negative.   Physical Exam Vital Signs  I have reviewed the triage vital signs BP (!) 109/58   Pulse (!) 47   Temp 98.1 F (36.7 C) (Temporal)   Resp 15   Ht 6' (1.829 m)   Wt 72.6 kg   SpO2 97%   BMI 21.70 kg/m  Physical Exam Vitals and nursing note reviewed.  Constitutional:      General: He is not in acute distress.    Appearance: Normal appearance.  HENT:     Mouth/Throat:     Mouth: Mucous membranes are moist.  Eyes:     Conjunctiva/sclera: Conjunctivae normal.  Cardiovascular:     Rate  and Rhythm: Normal rate and regular rhythm.  Pulmonary:     Effort: Pulmonary effort is normal. No respiratory distress.     Breath sounds: Normal breath sounds.  Abdominal:     General: Abdomen is flat.     Palpations: Abdomen is soft.     Tenderness: There is abdominal tenderness in the right upper quadrant. There is right CVA tenderness.  Musculoskeletal:     Right lower leg: No edema.     Left lower leg: No edema.  Skin:    General: Skin is warm and dry.     Capillary Refill: Capillary refill takes less than 2 seconds.  Neurological:     Mental Status: He is alert and oriented to person, place, and time. Mental status is at baseline.  Psychiatric:        Mood and Affect: Mood normal.        Behavior: Behavior normal.     ED Results and Treatments Labs (all labs ordered are listed, but only abnormal results are displayed) Labs Reviewed  COMPREHENSIVE METABOLIC PANEL - Abnormal; Notable for the following components:      Result Value   Sodium 133 (*)    CO2 21 (*)    Glucose, Bld 106 (*)    BUN 23 (*)    Creatinine, Ser 1.57 (*)    Total Bilirubin 1.8 (*)    GFR, Estimated 59 (*)    All other components within normal limits  CBC - Abnormal; Notable for the following components:   WBC 11.3 (*)    All other components within normal limits  URINALYSIS, ROUTINE W REFLEX  MICROSCOPIC - Abnormal; Notable for the following components:   Specific Gravity, Urine >1.030 (*)    Bilirubin Urine SMALL (*)    Ketones, ur >80 (*)    All other components within normal limits  LIPASE, BLOOD                                                                                                                          Radiology US Abdomen Limited RUQ (LIVER/GB) Result Date: 02/22/2024 CLINICAL DATA:  Right upper quadrant pain EXAM: ULTRASOUND ABDOMEN LIMITED RIGHT UPPER QUADRANT COMPARISON:  CT 08/14/2022. renal ultrasound 2007 FINDINGS: Gallbladder: Distended gallbladder. No shadowing stones. No wall thickening or adjacent fluid. Common bile duct: Diameter: 2 mm Liver: No focal lesion identified. Within normal limits in parenchymal echogenicity. Portal vein is patent on color Doppler imaging with normal direction of blood flow towards the liver. Other: Mild ectasia of the right renal collecting system. IMPRESSION: No gallstones or ductal dilatation.  Gallbladder distended. There is contusion of the right renal collecting system. Previous known renal stones. If needed dedicated renal stone CT may be useful as the next step in the workup as clinically appropriate Electronically Signed   By: Karen Kays M.D.   On: 02/22/2024 11:01    Pertinent labs & imaging results that were available during my care of the  patient were reviewed by me and considered in my medical decision making (see MDM for details).  Medications Ordered in ED Medications  sodium chloride 0.9 % bolus 1,000 mL (0 mLs Intravenous Stopped 02/22/24 1200)  ondansetron (ZOFRAN) injection 4 mg (4 mg Intravenous Given 02/22/24 1010)  morphine (PF) 4 MG/ML injection 4 mg (4 mg Intravenous Given 02/22/24 1007)  ketorolac (TORADOL) 15 MG/ML injection 15 mg (15 mg Intravenous Given 02/22/24 1050)                                                                                                                                      Procedures Procedures  (including critical care time)  Medical Decision Making / ED Course   MDM:  38 year old presenting to the emergency department with right upper quadrant/right flank pain.  Patient overall well-appearing, does appear somewhat uncomfortable.  Does have some right upper quadrant and right CVA tenderness on exam.  Initially considered gallbladder pathology, given tenderness.  LFTs reassuring but slight elevation of bilirubin.  Right upper quadrant ultrasound performed with no evidence of cholecystitis, gallstones.  Given history of kidney stone, will obtain CT renal stone protocol to evaluate for kidney stone, other intra-abdominal process such as obstruction, perforation, appendicitis.  Will reassess.  Clinical Course as of 02/22/24 1552  Mon Feb 22, 2024  1550 Signed out to oncoming provider pending CT results.  On my interpretation does show possible right sided nephrolithiasis. [WS]    Clinical Course User Index [WS] Lonell Grandchild, MD     Additional history obtained: -Additional history obtained from family -External records from outside source obtained and reviewed including: Chart review including previous notes, labs, imaging, consultation notes including prior notes    Lab Tests: -I ordered, reviewed, and interpreted labs.   The pertinent results include:   Labs Reviewed  COMPREHENSIVE METABOLIC PANEL - Abnormal; Notable for the following components:      Result Value   Sodium 133 (*)    CO2 21 (*)    Glucose, Bld 106 (*)    BUN 23 (*)    Creatinine, Ser 1.57 (*)    Total Bilirubin 1.8 (*)    GFR, Estimated 59 (*)    All other components within normal limits  CBC - Abnormal; Notable for the following components:   WBC 11.3 (*)    All other components within normal limits  URINALYSIS, ROUTINE W REFLEX MICROSCOPIC - Abnormal; Notable for the following components:   Specific Gravity, Urine >1.030 (*)    Bilirubin Urine SMALL (*)     Ketones, ur >80 (*)    All other components within normal limits  LIPASE, BLOOD    Notable for AKI, concentrated urine    Imaging Studies ordered: I ordered imaging studies including RUQ [place]  On my interpretation imaging demonstrates no acute process I independently visualized and interpreted imaging. I agree with the radiologist interpretation  Medicines ordered and prescription drug management: Meds ordered this encounter  Medications   sodium chloride 0.9 % bolus 1,000 mL   ondansetron (ZOFRAN) injection 4 mg   morphine (PF) 4 MG/ML injection 4 mg   ketorolac (TORADOL) 15 MG/ML injection 15 mg    -I have reviewed the patients home medicines and have made adjustments as needed   Reevaluation: After the interventions noted above, I reevaluated the patient and found that their symptoms have improved  Co morbidities that complicate the patient evaluation History reviewed. No pertinent past medical history.    Dispostion: Disposition decision including need for hospitalization was considered, and patient disposition pending at time of sign out.    Final Clinical Impression(s) / ED Diagnoses Final diagnoses:  Right flank pain     This chart was dictated using voice recognition software.  Despite best efforts to proofread,  errors can occur which can change the documentation meaning.    Lonell Grandchild, MD 02/22/24 [phone]

## 2024-02-22 NOTE — ED Triage Notes (Signed)
 Pt came in via POV d/t RUQ abd pain for the past few days, endorses n/v yesterday & some this morning. Reports family Hx of gallbladder issues & does have Hx of kidney stones. A/Ox4, rates pain 8/10 during triage.

## 2024-02-22 NOTE — ED Provider Notes (Signed)
 Patient signed out to me by previous provider. Please refer to their note for full HPI.  Briefly this is a 38 year old male who presented the emergency department flank pain.  We are pending CT imaging at time of signout.  He had a mild AKI, currently pain is controlled.  CT imaging identifies a 6 mm right sided stone.  Urinalysis shows no infection, white blood cells are normal.  Vitals are normal.  Patient is tolerating p.o. and currently pain controlled.  Will plan for outpatient medications and encouraged establishment with urology.  Patient at this time appears safe and stable for discharge and close outpatient follow up. Discharge plan and strict return to ED precautions discussed, patient verbalizes understanding and agreement.   Rozelle Logan, DO 02/22/24 1909

## 2024-02-22 NOTE — ED Notes (Signed)
 Pt states he could not urinate at this time.

## 2024-02-22 NOTE — ED Notes (Signed)
 [place] at bedside

## 2024-02-22 NOTE — Discharge Instructions (Addendum)
 You have been seen and discharged from the emergency department.  You were found to have a right-sided kidney stone.  You have been prescribed oral medication.  You may take ibuprofen every 6-8 hours as needed.  Take stronger pain medicine as needed.  Do not mix this medication with alcohol or other sedating medications. Do not drive or do heavy physical activity until you know how this medication affects you.  It may cause drowsiness.  I have attached follow-up for urology, you should establish care.  Follow-up with your primary provider for further evaluation and further care. Take home medications as prescribed. If you have any worsening symptoms or further concerns for your health please return to an emergency department for further evaluation.
